# Patient Record
Sex: Male | Born: 1980 | Race: White | Hispanic: No | Marital: Single | Smoking: Current every day smoker
Health system: Southern US, Community
[De-identification: ages and names within clinical notes are randomized; demographics above are authoritative.]

## PROBLEM LIST (undated history)

## (undated) HISTORY — PX: TYMPANOSTOMY TUBE PLACEMENT: SHX32

---

## 2014-11-30 ENCOUNTER — Ambulatory Visit
Admit: 2014-11-30 | Disposition: A | Discharge status: Home / Self Care | Payer: Self-pay | Attending: Family Medicine | Admitting: Family Medicine

## 2014-11-30 LAB — URINALYSIS, COMPLETE
BILIRUBIN, UR: NEGATIVE
Bacteria: NEGATIVE
Blood: NEGATIVE
Glucose,UR: NEGATIVE
Ketone: NEGATIVE
Leukocyte Esterase: NEGATIVE
Nitrite: NEGATIVE
PH: 5 (ref 5.0–8.0)
PROTEIN: NEGATIVE
RBC,UR: NONE SEEN /HPF (ref 0–5)
SPECIFIC GRAVITY: 1.01 (ref 1.000–1.030)
Squamous Epithelial: NONE SEEN

## 2014-11-30 LAB — OCCULT BLOOD X 1 CARD TO LAB, STOOL: Occult Blood, Feces: NEGATIVE

## 2016-03-13 ENCOUNTER — Encounter: Payer: Self-pay | Admitting: Emergency Medicine

## 2016-03-13 ENCOUNTER — Emergency Department
Admission: EM | Admit: 2016-03-13 | Discharge: 2016-03-13 | Disposition: A | Discharge status: Home / Self Care | Payer: Self-pay | Attending: Emergency Medicine | Admitting: Emergency Medicine

## 2016-03-13 DIAGNOSIS — F17200 Nicotine dependence, unspecified, uncomplicated: Secondary | ICD-10-CM | POA: Insufficient documentation

## 2016-03-13 DIAGNOSIS — B349 Viral infection, unspecified: Secondary | ICD-10-CM | POA: Insufficient documentation

## 2016-03-13 MED ORDER — IBUPROFEN 800 MG PO TABS: 800.0000 mg | ORAL_TABLET | Freq: Three times a day (TID) | ORAL | 0 refills | Status: DC | PRN

## 2016-03-13 MED ORDER — ONDANSETRON 8 MG PO TBDP
8.0000 mg | ORAL_TABLET | Freq: Once | ORAL | Status: AC
  Administered 2016-03-13: 8 mg via ORAL
  Filled 2016-03-13: qty 1

## 2016-03-13 MED ORDER — BENZONATATE 100 MG PO CAPS: 100.0000 mg | ORAL_CAPSULE | Freq: Three times a day (TID) | ORAL | 0 refills | Status: DC | PRN

## 2016-03-13 MED ORDER — FEXOFENADINE-PSEUDOEPHED ER 60-120 MG PO TB12: 1.0000 | ORAL_TABLET | Freq: Two times a day (BID) | ORAL | 0 refills | Status: DC

## 2016-03-13 MED ORDER — IBUPROFEN 800 MG PO TABS
800.0000 mg | ORAL_TABLET | Freq: Once | ORAL | Status: AC
  Administered 2016-03-13: 800 mg via ORAL
  Filled 2016-03-13: qty 1

## 2016-03-13 NOTE — ED Provider Notes (Signed)
[facility] Emergency Department Provider Note   ____________________________________________  Time seen: Approximately 5:45 PM  I have reviewed the triage vital signs and the nursing notes.   HISTORY  Chief Complaint Generalized Body Aches    HPI Mark Gordon is a 35 y.o. male patient complaining of 2 days of fever, chills, nasal congestion and facial pressure and pain. Patient also complaining of nonproductive cough. Patient stated he had 2 episodes of vomiting yesterday but only had nausea today. Patient denies diarrhea. Also complaining of generalized body aches. No palliative measures taken for this complaint.  History reviewed. No pertinent past medical history.  There are no active problems to display for this patient.   History reviewed. No pertinent surgical history.    Allergies Review of patient's allergies indicates no known allergies.  No family history on file.  Social History Social History  Substance Use Topics  . Smoking status: Current Every Day Smoker  . Smokeless tobacco: Never Used  . Alcohol use Not on file    Review of Systems Constitutional:  objective fever and chills. Generalized body aches Eyes: No visual changes. ENT: Sore throat Cardiovascular: Denies chest pain. Respiratory: Denies shortness of breath. Nonproductive cough Gastrointestinal: No abdominal pain.  Nausea without vomiting.  No diarrhea.  No constipation. Genitourinary: Negative for dysuria. Musculoskeletal: Negative for back pain. Skin: Negative for rash. Neurological: Frontal headache for headaches, focal weakness or numbness.    ____________________________________________   PHYSICAL EXAM:  VITAL SIGNS: ED Triage Vitals  Enc Vitals Group     BP 03/13/16 1734 124/86     Pulse Rate 03/13/16 1734 76     Resp 03/13/16 1734 18     Temp 03/13/16 1734 98.1 F (36.7 C)     Temp src --      SpO2 03/13/16 1734 98 %     Weight  03/13/16 1734 245 lb (111.1 kg)     Height 03/13/16 1734  (1.88 m)     Head Circumference --      Peak Flow --      Pain Score 03/13/16 1735 7     Pain Loc --      Pain Edu? --      Excl. in GC? --     Constitutional: Alert and oriented. Well appearing and in no acute distress. Eyes: Conjunctivae are normal. PERRL. EOMI. Head: Atraumatic. Nose: Nasal congestion with clear rhinorrhea Mouth/Throat: Mucous membranes are moist.  Oropharynx erythematous without exudative tonsils Neck: No stridor.  No cervical spine tenderness to palpation. Hematological/Lymphatic/Immunilogical: No cervical lymphadenopathy. Cardiovascular: Normal rate, regular rhythm. Grossly normal heart sounds.  Good peripheral circulation. Respiratory: Normal respiratory effort.  No retractions. Lungs CTAB. Gastrointestinal: Soft and nontender. No distention. No abdominal bruits. No CVA tenderness. Musculoskeletal: No lower extremity tenderness nor edema.  No joint effusions. Neurologic:  Normal speech and language. No gross focal neurologic deficits are appreciated. No gait instability. Skin:  Skin is warm, dry and intact. No rash noted. Psychiatric: Mood and affect are normal. Speech and behavior are normal.  ____________________________________________   LABS (all labs ordered are listed, but only abnormal results are displayed)  Labs Reviewed - No data to display ____________________________________________  EKG   ____________________________________________  RADIOLOGY   ____________________________________________   PROCEDURES  Procedure(s) performed: None  Procedures  Critical Care performed: No  ____________________________________________   INITIAL IMPRESSION / ASSESSMENT AND PLAN / ED COURSE  Pertinent labs & imaging results that were available during my care  of the patient were reviewed by me and considered in my medical decision making (see chart for details).  Viral illness.  Patient given discharge care instructions. Patient given a prescription for Allegra-D, ibuprofen, and Tessalon Perles. Patient given a work note. Patient [facility].  Clinical Course     ____________________________________________   FINAL CLINICAL IMPRESSION(S) / ED DIAGNOSES  Final diagnoses:  None      NEW MEDICATIONS STARTED DURING THIS VISIT:  New Prescriptions   No medications on file     Note:  This document was prepared using Dragon voice recognition software and may include unintentional dictation errors.    Joni Reining, PA-C 03/13/16 1754    Myrna Blazer, MD 03/13/16 [phone]

## 2016-03-13 NOTE — ED Notes (Signed)
States he developed body aches with some nausea which started couple of days ago.  Afebrile on arrival

## 2016-03-13 NOTE — ED Triage Notes (Signed)
Pt presents with all over body aches and chills for a couple of days.

## 2016-06-20 ENCOUNTER — Emergency Department
Admission: EM | Admit: 2016-06-20 | Discharge: 2016-06-20 | Disposition: A | Discharge status: Home / Self Care | Payer: Self-pay | Attending: Emergency Medicine | Admitting: Emergency Medicine

## 2016-06-20 DIAGNOSIS — J069 Acute upper respiratory infection, unspecified: Secondary | ICD-10-CM | POA: Insufficient documentation

## 2016-06-20 DIAGNOSIS — Z791 Long term (current) use of non-steroidal anti-inflammatories (NSAID): Secondary | ICD-10-CM | POA: Insufficient documentation

## 2016-06-20 DIAGNOSIS — F17200 Nicotine dependence, unspecified, uncomplicated: Secondary | ICD-10-CM | POA: Insufficient documentation

## 2016-06-20 LAB — POCT RAPID STREP A: Streptococcus, Group A Screen (Direct): NEGATIVE

## 2016-06-20 MED ORDER — IBUPROFEN 800 MG PO TABS
800.0000 mg | ORAL_TABLET | Freq: Once | ORAL | Status: AC
  Administered 2016-06-20: 800 mg via ORAL
  Filled 2016-06-20: qty 1

## 2016-06-20 MED ORDER — BENZONATATE 100 MG PO CAPS: 100.0000 mg | ORAL_CAPSULE | Freq: Four times a day (QID) | ORAL | 0 refills | Status: DC | PRN

## 2016-06-20 MED ORDER — GUAIFENESIN ER 600 MG PO TB12: 600.0000 mg | ORAL_TABLET | Freq: Two times a day (BID) | ORAL | 0 refills | Status: AC

## 2016-06-20 NOTE — ED Provider Notes (Signed)
[facility] Emergency Department Provider Note  ____________________________________________  Time seen: Approximately 7:18 PM  I have reviewed the triage vital signs and the nursing notes.   HISTORY  Chief Complaint Sore Throat and Nasal Congestion    HPI Mark Gordon is a 35 y.o. male  with ongoing tobacco abuse presenting withseveral days of cough that is occasionally productive of clear phlegm, congestion and rhinorrhea, and mild sore throat. No ear pain. No shortness of breath. He has had fevers just over 100 as recently as yesterday. Positive mild headache. No nausea vomiting or diarrhea, myalgias, or abdominal pain. Has tried Nyquil and Dayquil with some improvement. There are multiple sick contacts at work with similar symptoms. No travel outside the [place].   History reviewed. No pertinent past medical history.  There are no active problems to display for this patient.   History reviewed. No pertinent surgical history.  Current Outpatient Rx  . Order #: [id number] Class: Print  . Order #: [id number] Class: Print  . Order #: [id number] Class: Print  . Order #: [id number] Class: Print    Allergies Review of patient's allergies indicates no known allergies.  No family history on file.  Social History Social History  Substance Use Topics  . Smoking status: Current Every Day Smoker  . Smokeless tobacco: Never Used  . Alcohol use No    Review of Systems Constitutional: Positive fever.  No myalgia. Eyes: No visual changes.  No eye discharge. ENT: No sore throat. Positive congestion and rhinorrhea. Cardiovascular: Denies chest pain. Denies palpitations. Respiratory: Denies shortness of breath.  Positive productive cough. Gastrointestinal: No abdominal pain.  No nausea, no vomiting.  No diarrhea.  No constipation. Genitourinary: Negative for dysuria. Musculoskeletal: Negative for back pain. Skin: Negative for rash. Neurological:  Positive for headaches. No focal numbness, tingling or weakness.   10-point ROS otherwise negative.  ____________________________________________   PHYSICAL EXAM:  VITAL SIGNS: ED Triage Vitals [06/20/16 1840]  Enc Vitals Group     BP (!) 130/98     Pulse Rate 95     Resp 18     Temp 97.9 F (36.6 C)     Temp Source Oral     SpO2 98 %     Weight 225 lb (102.1 kg)     Height 6\' 2"  (1.88 m)     Head Circumference      Peak Flow      Pain Score 6     Pain Loc      Pain Edu?      Excl. in GC?     Constitutional: Alert and oriented. Coughing but comfortable and in no acute distress. Answers questions appropriately. Eyes: Conjunctivae are normal.  EOMI. No scleral icterus.  No eye discharge. EARS: TM's are obscured by wax bilaterally. Head: Atraumatic. Nose: Positive congestion/rhinnorhea. Mouth/Throat: Mucous membranes are moist. Mild posterior pharyngeal erythema without tonsillar swelling or exudate. Posterior palate is symmetric and uvula is midline. Positive halitosis. Neck: No stridor.  Supple.  No meningismus. Cardiovascular: Normal rate, regular rhythm. No murmurs, rubs or gallops.  Respiratory: Normal respiratory effort.  No accessory muscle use or retractions. Lungs CTAB.  No wheezes, rales or ronchi. Gastrointestinal: Soft, nontender and nondistended.  No guarding or rebound.  No peritoneal signs. Musculoskeletal: No LE edema.  Neurologic:  A&Ox3.  Speech is clear.  Face and smile are symmetric.  EOMI.  Moves all extremities well. Skin:  Skin is warm, dry and intact. No rash noted. Psychiatric: Mood and affect  are normal. Speech and behavior are normal.  Normal judgement.  ____________________________________________   LABS (all labs ordered are listed, but only abnormal results are displayed)  Labs Reviewed  POCT RAPID STREP A   ____________________________________________  EKG  Not  indicated ____________________________________________  RADIOLOGY  No results found.  ____________________________________________   PROCEDURES  Procedure(s) performed: None  Procedures  Critical Care performed: No ____________________________________________   INITIAL IMPRESSION / ASSESSMENT AND PLAN / ED COURSE  Pertinent labs & imaging results that were available during my care of the patient were reviewed by me and considered in my medical decision making (see chart for details).  35 y.o. male with multiple sick contacts at work presenting with 2 days of cough productive of clear phlegm, fever, congestion and rhinorrhea, and mild headache. Overall, the patient's clinical history and physical examination is most consistent with an upper respiratory infection. I do hear abnormalities on his lung auscultation that would be concerning for pneumonia, and this is less likely. He does have some erythema in the posterior pharynx, we'll get a strep test. I have discussed expected management as well as symptomatic treatment with the patient at length, and if his workup in the emergency department is reassuring, plan discharge home. Return precautions as well as follow-up instructions were discussed.  ____________________________________________  FINAL CLINICAL IMPRESSION(S) / ED DIAGNOSES  Final diagnoses:  Upper respiratory tract infection, unspecified type    Clinical Course      NEW MEDICATIONS STARTED DURING THIS VISIT:  New Prescriptions   BENZONATATE (TESSALON PERLES) 100 MG CAPSULE    Take 1 capsule (100 mg total) by mouth every 6 (six) hours as needed for cough.   GUAIFENESIN (MUCINEX) 600 MG 12 HR TABLET    Take 1 tablet (600 mg total) by mouth 2 (two) times daily.      Rockne MenghiniAnne-Caroline Zyliah Schier, MD 06/20/16 1952

## 2016-06-20 NOTE — ED Triage Notes (Signed)
Sore throat and clear phlem with productive cough X 2 days. Headache. Pt alert and oriented X4, active, cooperative, pt in NAD. RR even and unlabored, color WNL.

## 2016-06-20 NOTE — Discharge Instructions (Signed)
Please drink plenty of fluid to stay well-hydrated. Please get plenty of rest. Please practice frequent and good handwashing to prevent the spread of infection.  You may take Mucinex for your congestion, or additionally may try a Netty pot.  Tessalon Perles or for cough. You may take Tylenol or Motrin for fever and pain.  Return to the emergency department if you develop severe pain, shortness of breath, drooling, fever, or any other symptoms concerning to you.

## 2016-11-20 ENCOUNTER — Emergency Department
Admission: EM | Admit: 2016-11-20 | Discharge: 2016-11-20 | Disposition: A | Discharge status: Home / Self Care | Payer: Self-pay | Attending: Emergency Medicine | Admitting: Emergency Medicine

## 2016-11-20 ENCOUNTER — Encounter: Payer: Self-pay | Admitting: Emergency Medicine

## 2016-11-20 DIAGNOSIS — F17200 Nicotine dependence, unspecified, uncomplicated: Secondary | ICD-10-CM | POA: Insufficient documentation

## 2016-11-20 DIAGNOSIS — J111 Influenza due to unidentified influenza virus with other respiratory manifestations: Secondary | ICD-10-CM | POA: Insufficient documentation

## 2016-11-20 MED ORDER — OSELTAMIVIR PHOSPHATE 75 MG PO CAPS: 75.0000 mg | ORAL_CAPSULE | Freq: Two times a day (BID) | ORAL | 0 refills | Status: AC

## 2016-11-20 MED ORDER — BENZONATATE 100 MG PO CAPS: 100.0000 mg | ORAL_CAPSULE | Freq: Three times a day (TID) | ORAL | 0 refills | Status: AC | PRN

## 2016-11-20 NOTE — ED Notes (Signed)
See triage note   States he developed cough and congestion couple of days ago  Unsure of fever   Is afebrile on arrival

## 2016-11-20 NOTE — ED Triage Notes (Signed)
Pt to ed with c/o cough and congestion since Sunday, denies fever.

## 2016-11-20 NOTE — ED Provider Notes (Signed)
[facility] Emergency Department Provider Note  ____________________________________________  Time seen: Approximately 6:36 PM  I have reviewed the triage vital signs and the nursing notes.   HISTORY  Chief Complaint Cough and Nasal Congestion    HPI Mark Gordon is a 36 y.o. male presenting to the emergency department with headache, cough, congestion and myalgias for the past 2 days. Patient has not evaluated his temperature. However, he has had chills. Patient has had intermittent nausea but no vomiting. Patient denies diarrhea. He has experienced diminished appetite and increased sleep. Patient denies chest pain, chest tightness and shortness of breath. No alleviating measures have been attempted.   History reviewed. No pertinent past medical history.  There are no active problems to display for this patient.   History reviewed. No pertinent surgical history.  Prior to Admission medications   Medication Sig Start Date End Date Taking? Authorizing Provider  benzonatate (TESSALON PERLES) 100 MG capsule Take 1 capsule (100 mg total) by mouth 3 (three) times daily as needed for cough. 11/20/16 11/27/16  Dayna Barker Crystalina Stodghill, PA-C  fexofenadine-pseudoephedrine (ALLEGRA-D) 60-120 MG 12 hr tablet Take 1 tablet by mouth 2 (two) times daily. 03/13/16   Joni Reining, PA-C  guaiFENesin (MUCINEX) 600 MG 12 hr tablet Take 1 tablet (600 mg total) by mouth 2 (two) times daily. 06/20/16 06/20/17  Anne-Caroline Sharma Covert, MD  ibuprofen (ADVIL,MOTRIN) 800 MG tablet Take 1 tablet (800 mg total) by mouth every 8 (eight) hours as needed for moderate pain. 03/13/16   Joni Reining, PA-C  oseltamivir (TAMIFLU) 75 MG capsule Take 1 capsule (75 mg total) by mouth 2 (two) times daily. 11/20/16 11/25/16  Orvil Feil, PA-C    Allergies Patient has no known allergies.  History reviewed. No pertinent family history.  Social History Social History  Substance Use Topics  . Smoking  status: Current Every Day Smoker  . Smokeless tobacco: Never Used  . Alcohol use No    Review of Systems  Constitutional: Patient has had chills Eyes: No visual changes. No discharge ENT: Patient has had congestion.  Cardiovascular: no chest pain. Respiratory: Patient has had non-productive cough.  No SOB. Gastrointestinal: Patient has had nausea.  Genitourinary: Negative for dysuria. No hematuria Musculoskeletal: Patient has had myalgias. Skin: Negative for rash, abrasions, lacerations, ecchymosis. Neurological: Patient has headaches, no focal weakness or numbness.  ____________________________________________   PHYSICAL EXAM:  VITAL SIGNS: ED Triage Vitals [11/20/16 1650]  Enc Vitals Group     BP 137/87     Pulse Rate 74     Resp 18     Temp 97.5 F (36.4 C)     Temp Source Oral     SpO2 98 %     Weight 225 lb (102.1 kg)     Height      Head Circumference      Peak Flow      Pain Score 8     Pain Loc      Pain Edu?      Excl. in GC?     Constitutional: Alert and oriented. Patient is lying supine in bed.  Eyes: Conjunctivae are normal. PERRL. EOMI. Head: Atraumatic. ENT:      Ears: Tympanic membranes are injected bilaterally without evidence of effusion or purulent exudate. Bony landmarks are visualized bilaterally. No pain with palpation at the tragus.      Nose: Nasal turbinates are edematous and erythematous. Trace rhinorrhea visualized.      Mouth/Throat: Mucous membranes  are moist. Posterior pharynx is mildly erythematous. No tonsillar hypertrophy or purulent exudate. Uvula is midline. Neck: Full range of motion. No pain is elicited with flexion at the neck. Hematological/Lymphatic/Immunilogical: No cervical lymphadenopathy. Cardiovascular: Normal rate, regular rhythm. Normal S1 and S2.  Good peripheral circulation. Respiratory: Normal respiratory effort without tachypnea or retractions. Lungs CTAB. Good air entry to the bases with no decreased or absent  breath sounds. Gastrointestinal: Bowel sounds 4 quadrants. Soft and nontender to palpation. No guarding or rigidity. No palpable masses. No distention. No CVA tenderness.  Skin:  Skin is warm, dry and intact. No rash noted. Psychiatric: Mood and affect are normal. Speech and behavior are normal. Patient exhibits appropriate insight and judgement.  ____________________________________________   LABS (all labs ordered are listed, but only abnormal results are displayed)  Labs Reviewed - No data to display ____________________________________________  EKG   ____________________________________________  RADIOLOGY   No results found.  ____________________________________________    PROCEDURES  Procedure(s) performed:    Procedures    Medications - No data to display   ____________________________________________   INITIAL IMPRESSION / ASSESSMENT AND PLAN / ED COURSE  Pertinent labs & imaging results that were available during my care of the patient were reviewed by me and considered in my medical decision making (see chart for details).  Review of the [place] CSRS was performed in accordance of the [facility] prior to dispensing any controlled drugs.     Assessment and Plan:  Influenza: Patient presents to the emergency department with headache, rhinorrhea, congestion, myalgias, fatigue and diarrhea. Symptoms are consistent with influenza. Tamiflu was prescribed at discharge. Rest and hydration were encouraged. Patient was advised to follow-up with his primary care provider in one week. Physical exam and vital signs are reassuring at this time. All patient questions were answered.  ____________________________________________  FINAL CLINICAL IMPRESSION(S) / ED DIAGNOSES  Final diagnoses:  Influenza      NEW MEDICATIONS STARTED DURING THIS VISIT:  Discharge Medication List as of 11/20/2016  6:22 PM    START taking these medications   Details  oseltamivir (TAMIFLU)  75 MG capsule Take 1 capsule (75 mg total) by mouth 2 (two) times daily., Starting Tue 11/20/2016, Until Sun 11/25/2016, Print            This chart was dictated using voice recognition software/Dragon. Despite best efforts to proofread, errors can occur which can change the meaning. Any change was purely unintentional.    Orvil Feil, PA-C 11/20/16 1840    Minna Antis, MD 11/20/16 2215

## 2017-05-07 ENCOUNTER — Emergency Department
Admission: EM | Admit: 2017-05-07 | Discharge: 2017-05-07 | Disposition: A | Discharge status: Home / Self Care | Payer: Self-pay | Attending: Emergency Medicine | Admitting: Emergency Medicine

## 2017-05-07 DIAGNOSIS — B349 Viral infection, unspecified: Secondary | ICD-10-CM | POA: Insufficient documentation

## 2017-05-07 DIAGNOSIS — Z79899 Other long term (current) drug therapy: Secondary | ICD-10-CM | POA: Insufficient documentation

## 2017-05-07 DIAGNOSIS — F17200 Nicotine dependence, unspecified, uncomplicated: Secondary | ICD-10-CM | POA: Insufficient documentation

## 2017-05-07 DIAGNOSIS — J069 Acute upper respiratory infection, unspecified: Secondary | ICD-10-CM | POA: Insufficient documentation

## 2017-05-07 MED ORDER — MELOXICAM 7.5 MG PO TABS: 7.5000 mg | ORAL_TABLET | Freq: Every day | ORAL | 1 refills | Status: AC

## 2017-05-07 MED ORDER — BENZONATATE 100 MG PO CAPS: 100.0000 mg | ORAL_CAPSULE | Freq: Three times a day (TID) | ORAL | 0 refills | Status: AC | PRN

## 2017-05-07 MED ORDER — KETOROLAC TROMETHAMINE 30 MG/ML IJ SOLN
30.0000 mg | Freq: Once | INTRAMUSCULAR | Status: AC
  Administered 2017-05-07: 30 mg via INTRAMUSCULAR
  Filled 2017-05-07: qty 1

## 2017-05-07 NOTE — ED Provider Notes (Signed)
[facility] Emergency Department Provider Note  ____________________________________________  Time seen: Approximately 9:08 PM  I have reviewed the triage vital signs and the nursing notes.   HISTORY  Chief Complaint Headache and Generalized Body Aches    HPI Mark Gordon is a 36 y.o. male presenting to the emergency department with rhinorrhea, congestion, nonproductive cough and myalgias for the past 2 days patient is tolerating fluids and food by mouth. No emesis or diarrhea. Patient has experienced increased sleep. Patient has tried ibuprofen and other OTC medications without relief. No other alleviating measures have been attempted.   History reviewed. No pertinent past medical history.  There are no active problems to display for this patient.   History reviewed. No pertinent surgical history.  Prior to Admission medications   Medication Sig Start Date End Date Taking? Authorizing Provider  benzonatate (TESSALON PERLES) 100 MG capsule Take 1 capsule (100 mg total) by mouth 3 (three) times daily as needed for cough. 05/07/17 05/14/17  Orvil Feil, PA-C  fexofenadine-pseudoephedrine (ALLEGRA-D) 60-120 MG 12 hr tablet Take 1 tablet by mouth 2 (two) times daily. 03/13/16   Joni Reining, PA-C  guaiFENesin (MUCINEX) 600 MG 12 hr tablet Take 1 tablet (600 mg total) by mouth 2 (two) times daily. 06/20/16 06/20/17  Rockne Menghini, MD  ibuprofen (ADVIL,MOTRIN) 800 MG tablet Take 1 tablet (800 mg total) by mouth every 8 (eight) hours as needed for moderate pain. 03/13/16   Joni Reining, PA-C  meloxicam (MOBIC) 7.5 MG tablet Take 1 tablet (7.5 mg total) by mouth daily. 05/07/17 05/14/17  Orvil Feil, PA-C    Allergies Patient has no known allergies.  No family history on file.  Social History Social History  Substance Use Topics  . Smoking status: Current Every Day Smoker  . Smokeless tobacco: Never Used  . Alcohol use No    Review of  Systems  Constitutional: Patient has had fever.  Eyes: No visual changes. No discharge ENT: Patient has had congestion.  Cardiovascular: no chest pain. Respiratory: Patient has had non-productive cough.  No SOB. Gastrointestinal: No nausea, vomiting or diarrhea. Genitourinary: Negative for dysuria. No hematuria Musculoskeletal: Patient has had myalgias. Skin: Negative for rash, abrasions, lacerations, ecchymosis. Neurological: Patient has had headaches, no focal weakness or numbness.  ____________________________________________   PHYSICAL EXAM:  VITAL SIGNS: ED Triage Vitals [05/07/17 1837]  Enc Vitals Group     BP 128/88     Pulse Rate 68     Resp 18     Temp 98.5 F (36.9 C)     Temp Source Oral     SpO2 99 %     Weight 230 lb (104.3 kg)     Height  (1.88 m)     Head Circumference      Peak Flow      Pain Score 8     Pain Loc      Pain Edu?      Excl. in GC?      Constitutional: Alert and oriented. Patient is lying supine in bed.  Eyes: Conjunctivae are normal. PERRL. EOMI. Head: Atraumatic. ENT:      Ears: Tympanic membranes are injected bilaterally without evidence of effusion or purulent exudate. Bony landmarks are visualized bilaterally. No pain with palpation at the tragus.      Nose: Nasal turbinates are edematous and erythematous. Copious rhinorrhea visualized.      Mouth/Throat: Mucous membranes are moist. Posterior pharynx is mildly erythematous. No  tonsillar hypertrophy or purulent exudate. Uvula is midline. Neck: Full range of motion. No pain is elicited with flexion at the neck. Hematological/Lymphatic/Immunilogical: No cervical lymphadenopathy. Cardiovascular: Normal rate, regular rhythm. Normal S1 and S2.  Good peripheral circulation. Respiratory: Normal respiratory effort without tachypnea or retractions. Lungs CTAB. Good air entry to the bases with no decreased or absent breath sounds. Gastrointestinal: Bowel sounds 4 quadrants. Soft and  nontender to palpation. No guarding or rigidity. No palpable masses. No distention. No CVA tenderness.  Skin:  Skin is warm, dry and intact. No rash noted. Psychiatric: Mood and affect are normal. Speech and behavior are normal. Patient exhibits appropriate insight and judgement.   ____________________________________________   LABS (all labs ordered are listed, but only abnormal results are displayed)  Labs Reviewed - No data to display ____________________________________________  EKG   ____________________________________________  RADIOLOGY   No results found.  ____________________________________________    PROCEDURES  Procedure(s) performed:    Procedures    Medications  ketorolac (TORADOL) 30 MG/ML injection 30 mg (30 mg Intramuscular Given 05/07/17 2000)     ____________________________________________   INITIAL IMPRESSION / ASSESSMENT AND PLAN / ED COURSE  Pertinent labs & imaging results that were available during my care of the patient were reviewed by me and considered in my medical decision making (see chart for details).  Review of the [place] CSRS was performed in accordance of the [facility] prior to dispensing any controlled drugs.     Assessment and plan Viral URI Patient presents to the emergency department with rhinorrhea, congestion, headache and myalgias for the past 2 days. History and physical exam findings are consistent with a viral upper respiratory tract infection. Patient was discharged with [facility] and Mobic. Rest and hydration were encouraged. Patient was advised to follow-up with his primary care provider in one week. Vital signs are reassuring prior to discharge. All patient questions were answered.   ____________________________________________  FINAL CLINICAL IMPRESSION(S) / ED DIAGNOSES  Final diagnoses:  Viral syndrome      NEW MEDICATIONS STARTED DURING THIS VISIT:  Discharge Medication List as of 05/07/2017  7:59  PM    START taking these medications   Details  benzonatate (TESSALON PERLES) 100 MG capsule Take 1 capsule (100 mg total) by mouth 3 (three) times daily as needed for cough., Starting Tue 05/07/2017, Until Tue 05/14/2017, Print    meloxicam (MOBIC) 7.5 MG tablet Take 1 tablet (7.5 mg total) by mouth daily., Starting Tue 05/07/2017, Until Tue 05/14/2017, Print            This chart was dictated using voice recognition software/Dragon. Despite best efforts to proofread, errors can occur which can change the meaning. Any change was purely unintentional.    Gasper Lloyd 05/07/17 2112    Nita Sickle, MD 05/08/17 2231

## 2017-05-07 NOTE — ED Triage Notes (Signed)
Headache and body aches X 2 days. Has taken OTC medication without relief.

## 2017-09-24 ENCOUNTER — Other Ambulatory Visit: Payer: Self-pay

## 2017-09-24 ENCOUNTER — Emergency Department
Admission: EM | Admit: 2017-09-24 | Discharge: 2017-09-24 | Disposition: A | Discharge status: Home / Self Care | Payer: Managed Care, Other (non HMO) | Attending: Emergency Medicine | Admitting: Emergency Medicine

## 2017-09-24 DIAGNOSIS — R69 Illness, unspecified: Secondary | ICD-10-CM

## 2017-09-24 DIAGNOSIS — F17210 Nicotine dependence, cigarettes, uncomplicated: Secondary | ICD-10-CM | POA: Diagnosis not present

## 2017-09-24 DIAGNOSIS — J111 Influenza due to unidentified influenza virus with other respiratory manifestations: Secondary | ICD-10-CM | POA: Diagnosis not present

## 2017-09-24 DIAGNOSIS — R05 Cough: Secondary | ICD-10-CM | POA: Diagnosis present

## 2017-09-24 MED ORDER — OSELTAMIVIR PHOSPHATE 75 MG PO CAPS: 75.0000 mg | ORAL_CAPSULE | Freq: Two times a day (BID) | ORAL | 0 refills | Status: AC

## 2017-09-24 MED ORDER — GUAIFENESIN-CODEINE 100-10 MG/5ML PO SOLN: 5.0000 mL | Freq: Four times a day (QID) | ORAL | 0 refills | Status: DC | PRN

## 2017-09-24 NOTE — Discharge Instructions (Signed)
Follow-up with your primary care doctor or [facility] if any continued problems.  Take Tylenol or ibuprofen as needed for fever and body aches.  Begin taking Tamiflu twice a day every day for the next 5 days for the flu.  You are also given a prescription for Robitussin-AC which is for cough and congestion.  Take this medication only as directed. Increase fluids.

## 2017-09-24 NOTE — ED Provider Notes (Signed)
[facility] Emergency Department Provider Note  ____________________________________________   First MD Initiated Contact with Patient 09/24/17 1650     (approximate)  I have reviewed the triage vital signs and the nursing notes.   HISTORY  Chief Complaint URI   HPI Mark Gordon is a 37 y.o. male is here with complaint of headache, body aches, nausea, cough and congestion that started suddenly yesterday.  Patient has not actually taken his temperature but states that is been "up and down".  Patient has been taking over-the-counter medication for his fever.  No other family members are sick at this time however he was around some children that were sick over the weekend.  He rates his pain as an 8 out of 10.  History reviewed. No pertinent past medical history.  There are no active problems to display for this patient.   History reviewed. No pertinent surgical history.  Prior to Admission medications   Medication Sig Start Date End Date Taking? Authorizing Provider  guaiFENesin-codeine 100-10 MG/5ML syrup Take 5 mLs by mouth every 6 (six) hours as needed for cough. 09/24/17   Tommi RumpsSummers, Cecile Gillispie L, PA-C  oseltamivir (TAMIFLU) 75 MG capsule Take 1 capsule (75 mg total) by mouth 2 (two) times daily for 5 days. 09/24/17 09/29/17  Tommi RumpsSummers, Lauralyn Shadowens L, PA-C    Allergies Patient has no known allergies.  No family history on file.  Social History Social History   Tobacco Use  . Smoking status: Current Every Day Smoker  . Smokeless tobacco: Never Used  Substance Use Topics  . Alcohol use: Yes  . Drug use: No    Review of Systems Constitutional: Positive fever/chills Eyes: No visual changes. ENT: No sore throat. Cardiovascular: Denies chest pain. Respiratory: Denies shortness of breath.  Positive for coughing. Gastrointestinal: No abdominal pain.  No nausea, no vomiting.  No diarrhea.   Genitourinary: Negative for dysuria. Musculoskeletal: Positive  for muscle aches. Skin: Negative for rash. Neurological: Negative for headaches, focal weakness or numbness. ___________________________________________   PHYSICAL EXAM:  VITAL SIGNS: ED Triage Vitals  Enc Vitals Group     BP 09/24/17 1626 (!) 134/93     Pulse Rate 09/24/17 1626 86     Resp 09/24/17 1626 17     Temp 09/24/17 1626 98.3 F (36.8 C)     Temp Source 09/24/17 1626 Oral     SpO2 09/24/17 1626 98 %     Weight 09/24/17 1627 225 lb (102.1 kg)     Height 09/24/17 1627 6\' 2"  (1.88 m)     Head Circumference --      Peak Flow --      Pain Score 09/24/17 1626 8     Pain Loc --      Pain Edu? --      Excl. in GC? --    Constitutional: Alert and oriented. Well appearing and in no acute distress. Eyes: Conjunctivae are normal.  Head: Atraumatic. Nose: Mild congestion/rhinnorhea.  TMs are dull bilaterally. Mouth/Throat: Mucous membranes are moist.  Oropharynx non-erythematous. Neck: No stridor.   Hematological/Lymphatic/Immunilogical: No cervical lymphadenopathy. Cardiovascular: Normal rate, regular rhythm. Grossly normal heart sounds.  Good peripheral circulation. Respiratory: Normal respiratory effort.  No retractions. Lungs CTAB. Gastrointestinal: Soft and nontender. No distention.  Musculoskeletal: Moves upper and lower extremities without any difficulty.  Normal gait was noted. Neurologic:  Normal speech and language. No gross focal neurologic deficits are appreciated. No gait instability. Skin:  Skin is warm, dry and intact. No  rash noted. Psychiatric: Mood and affect are normal. Speech and behavior are normal.  ____________________________________________   LABS (all labs ordered are listed, but only abnormal results are displayed)  Labs Reviewed - No data to display   PROCEDURES  Procedure(s) performed: None  Procedures  Critical Care performed: No  ____________________________________________   INITIAL IMPRESSION / ASSESSMENT AND PLAN / ED  COURSE Patient was sudden onset of symptoms starting yesterday is consistent with influenza.  Patient agrees that he does not need to be tested.  He was given a prescription for Tamiflu 75 mg twice daily for 5 days and Robitussin-AC as needed for cough and congestion.  He will continue with Tylenol and ibuprofen as needed for fever.  Increase fluids.  He was also given a note to remain out of work.  ____________________________________________   FINAL CLINICAL IMPRESSION(S) / ED DIAGNOSES  Final diagnoses:  Influenza-like illness     ED Discharge Orders        Ordered    oseltamivir (TAMIFLU) 75 MG capsule  2 times daily     09/24/17 1657    guaiFENesin-codeine 100-10 MG/5ML syrup  Every 6 hours PRN     09/24/17 1657       Note:  This document was prepared using Dragon voice recognition software and may include unintentional dictation errors.    Tommi Rumps, PA-C 09/24/17 1722    Phineas Semen, MD 09/24/17 [phone]

## 2017-09-24 NOTE — ED Notes (Signed)
Pt discharged to home.  Family member driving.  Discharge instructions reviewed.  Verbalized understanding.  No questions or concerns at this time.  Teach back verified.  Pt in NAD.  No items left in ED.   

## 2017-09-24 NOTE — ED Notes (Signed)
Pt states he has been having flu-like symptoms for a couple of days.  Pt reports vomiting, feeling tired, and coughing a lot.  Pt is A&Ox4, in NAD.

## 2017-09-24 NOTE — ED Triage Notes (Signed)
Pt c/o bodyaches, nausea, HA, cough with congestion that started yesterday.

## 2017-09-26 ENCOUNTER — Emergency Department
Admission: EM | Admit: 2017-09-26 | Discharge: 2017-09-26 | Disposition: A | Discharge status: Home / Self Care | Payer: Managed Care, Other (non HMO) | Attending: Emergency Medicine | Admitting: Emergency Medicine

## 2017-09-26 ENCOUNTER — Other Ambulatory Visit: Payer: Self-pay

## 2017-09-26 ENCOUNTER — Encounter: Payer: Self-pay | Admitting: Emergency Medicine

## 2017-09-26 DIAGNOSIS — R0981 Nasal congestion: Secondary | ICD-10-CM | POA: Insufficient documentation

## 2017-09-26 DIAGNOSIS — F17200 Nicotine dependence, unspecified, uncomplicated: Secondary | ICD-10-CM | POA: Diagnosis not present

## 2017-09-26 DIAGNOSIS — J3489 Other specified disorders of nose and nasal sinuses: Secondary | ICD-10-CM | POA: Diagnosis not present

## 2017-09-26 DIAGNOSIS — R51 Headache: Secondary | ICD-10-CM | POA: Diagnosis present

## 2017-09-26 DIAGNOSIS — M7918 Myalgia, other site: Secondary | ICD-10-CM | POA: Insufficient documentation

## 2017-09-26 DIAGNOSIS — R112 Nausea with vomiting, unspecified: Secondary | ICD-10-CM | POA: Insufficient documentation

## 2017-09-26 DIAGNOSIS — R69 Illness, unspecified: Secondary | ICD-10-CM | POA: Diagnosis not present

## 2017-09-26 DIAGNOSIS — J111 Influenza due to unidentified influenza virus with other respiratory manifestations: Secondary | ICD-10-CM

## 2017-09-26 MED ORDER — ONDANSETRON HCL 4 MG PO TABS
4.0000 mg | ORAL_TABLET | Freq: Once | ORAL | Status: AC
  Administered 2017-09-26: 4 mg via ORAL
  Filled 2017-09-26: qty 1

## 2017-09-26 MED ORDER — KETOROLAC TROMETHAMINE 30 MG/ML IJ SOLN
30.0000 mg | Freq: Once | INTRAMUSCULAR | Status: AC
  Administered 2017-09-26: 30 mg via INTRAMUSCULAR
  Filled 2017-09-26: qty 1

## 2017-09-26 MED ORDER — ONDANSETRON HCL 4 MG PO TABS: 4.0000 mg | ORAL_TABLET | Freq: Every day | ORAL | 0 refills | Status: DC | PRN

## 2017-09-26 MED ORDER — IBUPROFEN 800 MG PO TABS: 800.0000 mg | ORAL_TABLET | Freq: Three times a day (TID) | ORAL | 0 refills | Status: DC | PRN

## 2017-09-26 NOTE — ED Notes (Signed)
See triage note   States he was seen 2 days ago   States he is still having headache ,body aches and nausea.Mark Gordon.afebrile on arrival  States he is taking the cough meds with tylenol/motrin  W/o relief

## 2017-09-26 NOTE — ED Provider Notes (Signed)
[facility] Emergency Department Provider Note  ____________________________________________  Time seen: Approximately 3:11 PM  I have reviewed the triage vital signs and the nursing notes.   HISTORY  Chief Complaint No chief complaint on file.    HPI Mark Gordon is a 37 y.o. male that presents emergency department for evaluation of continued headache and body aches after being diagnosed with influenza 2 days ago.  He was nauseous this morning and vomited once.  Patient also has a nonproductive cough but this has improved with the cough medication that he is prescribed.  He was prescribed Tamiflu but never began medication because it was expensive.  He has not taken any Tylenol or ibuprofen today.  No shortness of breath, chest pain, abdominal pain.   History reviewed. No pertinent past medical history.  There are no active problems to display for this patient.   History reviewed. No pertinent surgical history.  Prior to Admission medications   Medication Sig Start Date End Date Taking? Authorizing Provider  guaiFENesin-codeine 100-10 MG/5ML syrup Take 5 mLs by mouth every 6 (six) hours as needed for cough. 09/24/17   Tommi Rumps, PA-C  ibuprofen (ADVIL,MOTRIN) 800 MG tablet Take 1 tablet (800 mg total) by mouth every 8 (eight) hours as needed. 09/26/17   Enid Derry, PA-C  ondansetron (ZOFRAN) 4 MG tablet Take 1 tablet (4 mg total) by mouth daily as needed for nausea or vomiting. 09/26/17 09/26/18  Enid Derry, PA-C  oseltamivir (TAMIFLU) 75 MG capsule Take 1 capsule (75 mg total) by mouth 2 (two) times daily for 5 days. 09/24/17 09/29/17  Tommi Rumps, PA-C    Allergies Patient has no known allergies.  No family history on file.  Social History Social History   Tobacco Use  . Smoking status: Current Every Day Smoker  . Smokeless tobacco: Never Used  Substance Use Topics  . Alcohol use: Yes  . Drug use: No     Review of Systems   Eyes: No visual changes. No discharge. ENT: Positive for congestion and rhinorrhea. Cardiovascular: No chest pain. Respiratory: Positive for cough. No SOB. Gastrointestinal: No abdominal pain.  No nausea, no vomiting.  No diarrhea.  No constipation. Musculoskeletal: Positive for body aches. Skin: Negative for rash, abrasions, lacerations, ecchymosis. Neurological: Positive for headache.   ____________________________________________   PHYSICAL EXAM:  VITAL SIGNS: ED Triage Vitals  Enc Vitals Group     BP 09/26/17 1204 123/76     Pulse Rate 09/26/17 1204 66     Resp 09/26/17 1204 16     Temp 09/26/17 1204 98.5 F (36.9 C)     Temp Source 09/26/17 1204 Oral     SpO2 09/26/17 1204 98 %     Weight --      Height --      Head Circumference --      Peak Flow --      Pain Score 09/26/17 1210 8     Pain Loc --      Pain Edu? --      Excl. in GC? --      Constitutional: Alert and oriented. Well appearing and in no acute distress. Eyes: Conjunctivae are normal. PERRL. EOMI. No discharge. Head: Atraumatic. ENT: No frontal and maxillary sinus tenderness.      Ears: Tympanic membranes pearly gray with good landmarks. No discharge.      Nose: Mild congestion/rhinnorhea.      Mouth/Throat: Mucous membranes are moist. Oropharynx non-erythematous. Tonsils not enlarged.  No exudates. Uvula midline. Neck: No stridor.   Hematological/Lymphatic/Immunilogical: No cervical lymphadenopathy. Cardiovascular: Normal rate, regular rhythm.  Good peripheral circulation. Respiratory: Normal respiratory effort without tachypnea or retractions. Lungs CTAB. Good air entry to the bases with no decreased or absent breath sounds. Gastrointestinal: Bowel sounds 4 quadrants. Soft and nontender to palpation. No guarding or rigidity. No palpable masses. No distention. Musculoskeletal: Full range of motion to all extremities. No gross deformities appreciated. Neurologic:  Normal speech and language. No  gross focal neurologic deficits are appreciated.  Skin:  Skin is warm, dry and intact. No rash noted.  ____________________________________________   LABS (all labs ordered are listed, but only abnormal results are displayed)  Labs Reviewed - No data to display ____________________________________________  EKG   ____________________________________________  RADIOLOGY   ____________________________________________    PROCEDURES  Procedure(s) performed:    Procedures    Medications  ketorolac (TORADOL) 30 MG/ML injection 30 mg (30 mg Intramuscular Given 09/26/17 1322)  ondansetron (ZOFRAN) tablet 4 mg (4 mg Oral Given 09/26/17 1322)     ____________________________________________   INITIAL IMPRESSION / ASSESSMENT AND PLAN / ED COURSE  Pertinent labs & imaging results that were available during my care of the patient were reviewed by me and considered in my medical decision making (see chart for details).  Review of the [place] CSRS was performed in accordance of the [facility] prior to dispensing any controlled drugs.     Patient's diagnosis is consistent with influenza. Vital signs and exam are reassuring.  Headache and body aches improved with Toradol.  Nausea resolved with Zofran.  Cough has been improving since diagnosis of influenza 2 days ago.  Symptoms are consistent with influenza.  Patient appears well and is staying well hydrated. Patient should alternate tylenol and ibuprofen for fever and body aches. Patient feels comfortable going home.  Work note was provided.  Patient will be discharged home with prescriptions for zofran and ibuprofen. Patient is to follow up with PCP as needed or otherwise directed. Patient is given ED precautions to return to the ED for any worsening or new symptoms.     ____________________________________________  FINAL CLINICAL IMPRESSION(S) / ED DIAGNOSES  Final diagnoses:  Influenza-like illness      NEW MEDICATIONS STARTED  DURING THIS VISIT:  ED Discharge Orders        Ordered    ondansetron (ZOFRAN) 4 MG tablet  Daily PRN     09/26/17 1420    ibuprofen (ADVIL,MOTRIN) 800 MG tablet  Every 8 hours PRN     09/26/17 1420          This chart was dictated using voice recognition software/Dragon. Despite best efforts to proofread, errors can occur which can change the meaning. Any change was purely unintentional.    Enid DerryWagner, Vonya Ohalloran, PA-C 09/26/17 1541    Phineas SemenGoodman, Graydon, MD 09/27/17 [phone]

## 2017-09-26 NOTE — ED Triage Notes (Signed)
Says he was dx with flu.  Has been taking the med for cough, but still feels congested, body aches, headaches and nausea. Says he vomited once today and had dry heaves once

## 2017-12-17 ENCOUNTER — Emergency Department
Admission: EM | Admit: 2017-12-17 | Discharge: 2017-12-17 | Disposition: A | Discharge status: Home / Self Care | Payer: Managed Care, Other (non HMO) | Attending: Emergency Medicine | Admitting: Emergency Medicine

## 2017-12-17 ENCOUNTER — Encounter: Payer: Self-pay | Admitting: Emergency Medicine

## 2017-12-17 ENCOUNTER — Other Ambulatory Visit: Payer: Self-pay

## 2017-12-17 DIAGNOSIS — K529 Noninfective gastroenteritis and colitis, unspecified: Secondary | ICD-10-CM | POA: Diagnosis not present

## 2017-12-17 DIAGNOSIS — F17210 Nicotine dependence, cigarettes, uncomplicated: Secondary | ICD-10-CM | POA: Insufficient documentation

## 2017-12-17 DIAGNOSIS — M791 Myalgia, unspecified site: Secondary | ICD-10-CM | POA: Diagnosis present

## 2017-12-17 LAB — CBC WITH DIFFERENTIAL/PLATELET
BASOS PCT: 1 %
Basophils Absolute: 0 10*3/uL (ref 0–0.1)
EOS ABS: 0.2 10*3/uL (ref 0–0.7)
Eosinophils Relative: 3 %
HCT: 55.1 % — ABNORMAL HIGH (ref 40.0–52.0)
Hemoglobin: 19.2 g/dL — ABNORMAL HIGH (ref 13.0–18.0)
Lymphocytes Relative: 13 %
Lymphs Abs: 0.9 10*3/uL — ABNORMAL LOW (ref 1.0–3.6)
MCH: 33.6 pg (ref 26.0–34.0)
MCHC: 34.8 g/dL (ref 32.0–36.0)
MCV: 96.5 fL (ref 80.0–100.0)
MONO ABS: 0.5 10*3/uL (ref 0.2–1.0)
Monocytes Relative: 8 %
Neutro Abs: 5.3 10*3/uL (ref 1.4–6.5)
Neutrophils Relative %: 75 %
PLATELETS: 187 10*3/uL (ref 150–440)
RBC: 5.7 MIL/uL (ref 4.40–5.90)
RDW: 14 % (ref 11.5–14.5)
WBC: 6.9 10*3/uL (ref 3.8–10.6)

## 2017-12-17 LAB — COMPREHENSIVE METABOLIC PANEL
ALT: 38 U/L (ref 17–63)
AST: 43 U/L — ABNORMAL HIGH (ref 15–41)
Albumin: 4.4 g/dL (ref 3.5–5.0)
Alkaline Phosphatase: 107 U/L (ref 38–126)
Anion gap: 10 (ref 5–15)
BILIRUBIN TOTAL: 1.6 mg/dL — AB (ref 0.3–1.2)
BUN: 13 mg/dL (ref 6–20)
CO2: 24 mmol/L (ref 22–32)
Calcium: 9.3 mg/dL (ref 8.9–10.3)
Chloride: 100 mmol/L — ABNORMAL LOW (ref 101–111)
Creatinine, Ser: 1.09 mg/dL (ref 0.61–1.24)
GFR calc Af Amer: >60 mL/min (ref >60)
Glucose, Bld: 90 mg/dL (ref 65–99)
Potassium: 4.2 mmol/L (ref 3.5–5.1)
Sodium: 134 mmol/L — ABNORMAL LOW (ref 135–145)
TOTAL PROTEIN: 7.6 g/dL (ref 6.5–8.1)

## 2017-12-17 LAB — LIPASE, BLOOD: Lipase: 23 U/L (ref 11–51)

## 2017-12-17 MED ORDER — SODIUM CHLORIDE 0.9 % IV BOLUS
1000.0000 mL | Freq: Once | INTRAVENOUS | Status: AC
  Administered 2017-12-17: 1000 mL via INTRAVENOUS

## 2017-12-17 MED ORDER — ONDANSETRON HCL 4 MG/2ML IJ SOLN
4.0000 mg | Freq: Once | INTRAMUSCULAR | Status: AC
  Administered 2017-12-17: 4 mg via INTRAVENOUS
  Filled 2017-12-17: qty 2

## 2017-12-17 MED ORDER — ONDANSETRON 4 MG PO TBDP: 4.0000 mg | ORAL_TABLET | Freq: Three times a day (TID) | ORAL | 0 refills | Status: DC | PRN

## 2017-12-17 NOTE — ED Triage Notes (Signed)
Pt to ED from home c/o generalized body aches, nausea, congestion since Sunday night.

## 2017-12-17 NOTE — Discharge Instructions (Addendum)
Follow-up with your regular doctor or an acute care to have your CBC rechecked in 1 week.  If your hematocrit continues to stay high you should at least donate 1 L of blood to the blood bank and then follow-up with hematology.  A regular physician would have to refer you to the hematologist.  For the vomiting use the Zofran as needed.  For diarrhea use over-the-counter Imodium.  Try to eat a brat diet which includes bananas rice applesauce and toast.  A bland diet will help you with the diarrhea symptoms.

## 2017-12-17 NOTE — ED Provider Notes (Signed)
[facility] Emergency Department Provider Note  ____________________________________________   First MD Initiated Contact with Patient 12/17/17 1714     (approximate)  I have reviewed the triage vital signs and the nursing notes.   HISTORY  Chief Complaint Nausea; Generalized Body Aches; and Nasal Congestion    HPI Mark Gordon is a 37 y.o. male since emergency department complaining of body aches, nausea, and vomiting with diarrhea that started yesterday.  States the diarrhea is very watery.  He has had vomiting where he is unable to keep liquids down.  He denies any other issues at this time.  No one else at home has been sick with the same symptoms.  History reviewed. No pertinent past medical history.  There are no active problems to display for this patient.   Past Surgical History:  Procedure Laterality Date  . TYMPANOSTOMY TUBE PLACEMENT      Prior to Admission medications   Medication Sig Start Date End Date Taking? Authorizing Provider  guaiFENesin-codeine 100-10 MG/5ML syrup Take 5 mLs by mouth every 6 (six) hours as needed for cough. 09/24/17   Tommi Rumps, PA-C  ibuprofen (ADVIL,MOTRIN) 800 MG tablet Take 1 tablet (800 mg total) by mouth every 8 (eight) hours as needed. 09/26/17   Enid Derry, PA-C  ondansetron (ZOFRAN) 4 MG tablet Take 1 tablet (4 mg total) by mouth daily as needed for nausea or vomiting. 09/26/17 09/26/18  Enid Derry, PA-C  ondansetron (ZOFRAN-ODT) 4 MG disintegrating tablet Take 1 tablet (4 mg total) by mouth every 8 (eight) hours as needed for nausea or vomiting. 12/17/17   Sherrie Mustache Roselyn Bering, PA-C    Allergies Patient has no known allergies.  History reviewed. No pertinent family history.  Social History Social History   Tobacco Use  . Smoking status: Current Every Day Smoker    Packs/day: 0.25    Types: Cigarettes  . Smokeless tobacco: Never Used  Substance Use Topics  . Alcohol use: Yes   Comment: occ. on weekends  . Drug use: No    Review of Systems  Constitutional: No fever/chills Eyes: No visual changes. ENT: No sore throat. Respiratory: Denies cough Gastrointestinal: Positive for nausea, vomiting and diarrhea Genitourinary: Negative for dysuria. Musculoskeletal: Negative for back pain. Skin: Negative for rash.    ____________________________________________   PHYSICAL EXAM:  VITAL SIGNS: ED Triage Vitals  Enc Vitals Group     BP 12/17/17 1656 132/88     Pulse Rate 12/17/17 1656 73     Resp 12/17/17 1656 18     Temp 12/17/17 1656 98.2 F (36.8 C)     Temp src --      SpO2 12/17/17 1656 98 %     Weight 12/17/17 1656 225 lb (102.1 kg)     Height 12/17/17 1656  (1.88 m)     Head Circumference --      Peak Flow --      Pain Score 12/17/17 1704 9     Pain Loc --      Pain Edu? --      Excl. in GC? --     Constitutional: Alert and oriented. Well appearing and in no acute distress. Eyes: Conjunctivae are normal.  Head: Atraumatic. Nose: No congestion/rhinnorhea. Mouth/Throat: Mucous membranes are moist.   Cardiovascular: Normal rate, regular rhythm. Respiratory: Normal respiratory effort.  No retractions Abdomen: Soft nontender, hyperactive bowel sounds throughout all 4 quadrants GU: deferred Musculoskeletal: FROM all extremities, warm and well perfused Neurologic:  Normal speech and language.  Skin:  Skin is warm, dry and intact. No rash noted. Psychiatric: Mood and affect are normal. Speech and behavior are normal.  ____________________________________________   LABS (all labs ordered are listed, but only abnormal results are displayed)  Labs Reviewed  COMPREHENSIVE METABOLIC PANEL - Abnormal; Notable for the following components:      Result Value   Sodium 134 (*)    Chloride 100 (*)    AST 43 (*)    Total Bilirubin 1.6 (*)    All other components within normal limits  CBC WITH DIFFERENTIAL/PLATELET - Abnormal; Notable for the  following components:   Hemoglobin 19.2 (*)    HCT 55.1 (*)    Lymphs Abs 0.9 (*)    All other components within normal limits  LIPASE, BLOOD   ____________________________________________   ____________________________________________  RADIOLOGY    ____________________________________________   PROCEDURES  Procedure(s) performed: Saline lock, normal saline 1 L IV, CBC, complete metabolic panel, lipase  Procedures    ____________________________________________   INITIAL IMPRESSION / ASSESSMENT AND PLAN / ED COURSE  Pertinent labs & imaging results that were available during my care of the patient were reviewed by me and considered in my medical decision making (see chart for details).  Patient is 37 year old male presents emergency department complaining of nausea, vomiting or diarrhea for greater than 24 hours.  On physical exam the patient appears nontoxic.  Abdomen is soft and nontender.  Bowel sounds are hyperactive  The patient was given IV with 1 L normal saline, CBC, comprehensive metabolic panel and lipase are ordered   ----------------------------------------- 7:09 PM on 12/17/2017 -----------------------------------------  Labs are normal except for CBC shows polycythemia with high hematocrit and hemoglobin.  [place] lab findings with patient.  He states he is never been told he has polycythemia and does not know of anyone in his family that has anemias.  He was instructed to follow-up with hematology or his regular doctor to have a repeat CBC in 1 week.  Explained to him that due to dehydration from the vomiting and diarrhea may be a little more concentrated but that a level that high should be investigated.  States he understands will comply with instructions.  He was given a work note for today and tomorrow.  He was given a prescription for Zofran ODT.  He is to take over-the-counter Imodium for the diarrhea.  Brat diet was encouraged   As part of my  medical decision making, I reviewed the following data within the electronic MEDICAL RECORD NUMBER Nursing notes reviewed and incorporated, Labs reviewed ABC shows polycythemia the room remainder of the labs are normal, Old chart reviewed, Notes from prior ED visits and [place] Controlled Substance Database  ____________________________________________   FINAL CLINICAL IMPRESSION(S) / ED DIAGNOSES  Final diagnoses:  Acute gastroenteritis      NEW MEDICATIONS STARTED DURING THIS VISIT:  New Prescriptions   ONDANSETRON (ZOFRAN-ODT) 4 MG DISINTEGRATING TABLET    Take 1 tablet (4 mg total) by mouth every 8 (eight) hours as needed for nausea or vomiting.     Note:  This document was prepared using Dragon voice recognition software and may include unintentional dictation errors.    Faythe Ghee, PA-C 12/17/17 1912    Sharman Cheek, MD 12/18/17 2241

## 2017-12-19 ENCOUNTER — Encounter: Payer: Self-pay | Admitting: Emergency Medicine

## 2017-12-19 ENCOUNTER — Emergency Department
Admission: EM | Admit: 2017-12-19 | Discharge: 2017-12-19 | Disposition: A | Discharge status: Home / Self Care | Payer: Managed Care, Other (non HMO) | Attending: Emergency Medicine | Admitting: Emergency Medicine

## 2017-12-19 ENCOUNTER — Emergency Department: Payer: Managed Care, Other (non HMO)

## 2017-12-19 DIAGNOSIS — F17210 Nicotine dependence, cigarettes, uncomplicated: Secondary | ICD-10-CM | POA: Insufficient documentation

## 2017-12-19 DIAGNOSIS — R1110 Vomiting, unspecified: Secondary | ICD-10-CM

## 2017-12-19 DIAGNOSIS — R1011 Right upper quadrant pain: Secondary | ICD-10-CM | POA: Diagnosis not present

## 2017-12-19 DIAGNOSIS — R112 Nausea with vomiting, unspecified: Secondary | ICD-10-CM | POA: Insufficient documentation

## 2017-12-19 LAB — COMPREHENSIVE METABOLIC PANEL
ALK PHOS: 93 U/L (ref 38–126)
ALT: 34 U/L (ref 17–63)
ANION GAP: 10 (ref 5–15)
AST: 34 U/L (ref 15–41)
Albumin: 4.3 g/dL (ref 3.5–5.0)
BILIRUBIN TOTAL: 1.8 mg/dL — AB (ref 0.3–1.2)
BUN: 15 mg/dL (ref 6–20)
CALCIUM: 9.2 mg/dL (ref 8.9–10.3)
CO2: 23 mmol/L (ref 22–32)
Chloride: 101 mmol/L (ref 101–111)
Creatinine, Ser: 1.19 mg/dL (ref 0.61–1.24)
GFR calc Af Amer: >60 mL/min (ref >60)
Glucose, Bld: 117 mg/dL — ABNORMAL HIGH (ref 65–99)
Potassium: 4 mmol/L (ref 3.5–5.1)
Sodium: 134 mmol/L — ABNORMAL LOW (ref 135–145)
TOTAL PROTEIN: 7.5 g/dL (ref 6.5–8.1)

## 2017-12-19 LAB — CBC
HCT: 54.9 % — ABNORMAL HIGH (ref 40.0–52.0)
Hemoglobin: 19.1 g/dL — ABNORMAL HIGH (ref 13.0–18.0)
MCH: 33.7 pg (ref 26.0–34.0)
MCHC: 34.9 g/dL (ref 32.0–36.0)
MCV: 96.5 fL (ref 80.0–100.0)
Platelets: 167 K/uL (ref 150–440)
RBC: 5.69 MIL/uL (ref 4.40–5.90)
RDW: 13.5 % (ref 11.5–14.5)
WBC: 7.7 K/uL (ref 3.8–10.6)

## 2017-12-19 LAB — URINALYSIS, COMPLETE (UACMP) WITH MICROSCOPIC
Bilirubin Urine: NEGATIVE
Glucose, UA: NEGATIVE mg/dL
HGB URINE DIPSTICK: NEGATIVE
Ketones, ur: 20 mg/dL — AB
LEUKOCYTES UA: NEGATIVE
NITRITE: NEGATIVE
PH: 5 (ref 5.0–8.0)
Protein, ur: NEGATIVE mg/dL
Specific Gravity, Urine: 1.027 (ref 1.005–1.030)
Squamous Epithelial / LPF: NONE SEEN (ref 0–5)

## 2017-12-19 LAB — LIPASE, BLOOD: Lipase: 24 U/L (ref 11–51)

## 2017-12-19 MED ORDER — PROMETHAZINE HCL 12.5 MG PO TABS: 12.5000 mg | ORAL_TABLET | Freq: Four times a day (QID) | ORAL | 0 refills | Status: DC | PRN

## 2017-12-19 MED ORDER — PROMETHAZINE HCL 25 MG/ML IJ SOLN
12.5000 mg | Freq: Once | INTRAMUSCULAR | Status: AC
  Administered 2017-12-19: 12.5 mg via INTRAVENOUS
  Filled 2017-12-19: qty 1

## 2017-12-19 MED ORDER — SODIUM CHLORIDE 0.9 % IV BOLUS
1000.0000 mL | Freq: Once | INTRAVENOUS | Status: AC
  Administered 2017-12-19: 1000 mL via INTRAVENOUS

## 2017-12-19 NOTE — ED Notes (Addendum)
Pt c/o n/v and diarrhea x4 days. Pt reports taking zofran at home without any relief. Pt reports 3-5 episodes of emesis and diarrhea. Pt is unable to keep any food down at this time.

## 2017-12-19 NOTE — Discharge Instructions (Addendum)
he is follow closely with primary care doctor, her bilirubin is slightly elevated this is likely something that will pass to please have it rechecked, return to the emergency room for any new or worrisome symptoms including vomiting fever or worsening pain

## 2017-12-19 NOTE — ED Triage Notes (Signed)
Pt to ED with c/o vomiting and diarrhea. Pt seen here on 4/30 but states he is not getting any better. Pt states N/V/D continues and he is unable to keep anything down.

## 2017-12-19 NOTE — ED Provider Notes (Addendum)
[facility] Emergency Department Provider Note  ____________________________________________   I have reviewed the triage vital signs and the nursing notes. Where available I have reviewed prior notes and, if possible and indicated, outside hospital notes.    HISTORY  Chief Complaint No chief complaint on file.    HPI Mark Gordon is a 37 y.o. male   he was largely healthy, who presents today complaining of nausea vomiting diarrhea. He had it for a few days. He was seen here on the 30th at that time he is having watery diarrhea and vomiting. His vomiting is decreased is able to hold down some fluids including a sugary drink he had just prior to coming in however, he still is having watery diarrhea. Overall his symptoms are improving but he surprised that they're lasting this long and he wants to make sure nothing is okay. He denies any focal abdominal pain. He states he has cramping when he has diarrhea that relieves right afterwards. I did ask him if he could give me a stool sample he states that he is "about out of diarrhea" and actually hasn't had any significant diarrhea today. Overall and otherwise things are improving but he wants to be sure that nothing else is going on.      History reviewed. No pertinent past medical history.  There are no active problems to display for this patient.   Past Surgical History:  Procedure Laterality Date  . TYMPANOSTOMY TUBE PLACEMENT      Prior to Admission medications   Medication Sig Start Date End Date Taking? Authorizing Provider  guaiFENesin-codeine 100-10 MG/5ML syrup Take 5 mLs by mouth every 6 (six) hours as needed for cough. 09/24/17   Tommi Rumps, PA-C  ibuprofen (ADVIL,MOTRIN) 800 MG tablet Take 1 tablet (800 mg total) by mouth every 8 (eight) hours as needed. 09/26/17   Enid Derry, PA-C  ondansetron (ZOFRAN) 4 MG tablet Take 1 tablet (4 mg total) by mouth daily as needed for nausea or  vomiting. 09/26/17 09/26/18  Enid Derry, PA-C  ondansetron (ZOFRAN-ODT) 4 MG disintegrating tablet Take 1 tablet (4 mg total) by mouth every 8 (eight) hours as needed for nausea or vomiting. 12/17/17   Sherrie Mustache Roselyn Bering, PA-C    Allergies Patient has no known allergies.  History reviewed. No pertinent family history.  Social History Social History   Tobacco Use  . Smoking status: Current Every Day Smoker    Packs/day: 0.25    Types: Cigarettes  . Smokeless tobacco: Never Used  Substance Use Topics  . Alcohol use: Yes    Comment: occ. on weekends  . Drug use: No    Review of Systems Constitutional: No fever/chills Eyes: No visual changes. ENT: No sore throat. No stiff neck no neck pain Cardiovascular: Denies chest pain. Respiratory: Denies shortness of breath. Gastrointestinal:   see history of present illness  No constipation. Genitourinary: Negative for dysuria. Musculoskeletal: Negative lower extremity swelling Skin: Negative for rash. Neurological: Negative for severe headaches, focal weakness or numbness.   ____________________________________________   PHYSICAL EXAM:  VITAL SIGNS: ED Triage Vitals  Enc Vitals Group     BP 12/19/17 1436 118/85     Pulse Rate 12/19/17 1436 80     Resp 12/19/17 1436 16     Temp 12/19/17 1436 97.9 F (36.6 C)     Temp Source 12/19/17 1436 Oral     SpO2 12/19/17 1436 98 %     Weight --  Height --      Head Circumference --      Peak Flow --      Pain Score 12/19/17 1437 8     Pain Loc --      Pain Edu? --      Excl. in GC? --     Constitutional: Alert and oriented. Well appearing and in no acute distress. Eyes: Conjunctivae are normal Head: Atraumatic HEENT: No congestion/rhinnorhea. Mucous membranes are moist.  Oropharynx non-erythematous Neck:   Nontender with no meningismus, no masses, no stridor Cardiovascular: Normal rate, regular rhythm. Grossly normal heart sounds.  Good peripheral circulation. Respiratory:  Normal respiratory effort.  No retractions. Lungs CTAB. Abdominal: Soft and nontender. No distention. No guarding no rebound Back:  There is no focal tenderness or step off.  there is no midline tenderness there are no lesions noted. there is no CVA tenderness Musculoskeletal: No lower extremity tenderness, no upper extremity tenderness. No joint effusions, no DVT signs strong distal pulses no edema Neurologic:  Normal speech and language. No gross focal neurologic deficits are appreciated.  Skin:  Skin is warm, dry and intact. No rash noted. Psychiatric: Mood and affect are normal. Speech and behavior are normal.  ____________________________________________   LABS (all labs ordered are listed, but only abnormal results are displayed)  Labs Reviewed  CBC - Abnormal; Notable for the following components:      Result Value   Hemoglobin 19.1 (*)    HCT 54.9 (*)    All other components within normal limits  COMPREHENSIVE METABOLIC PANEL - Abnormal; Notable for the following components:   Sodium 134 (*)    Glucose, Bld 117 (*)    Total Bilirubin 1.8 (*)    All other components within normal limits  URINALYSIS, COMPLETE (UACMP) WITH MICROSCOPIC - Abnormal; Notable for the following components:   Color, Urine AMBER (*)    APPearance CLEAR (*)    Ketones, ur 20 (*)    Bacteria, UA RARE (*)    All other components within normal limits  GASTROINTESTINAL PANEL BY PCR, STOOL (REPLACES STOOL CULTURE)  C DIFFICILE QUICK SCREEN W PCR REFLEX  LIPASE, BLOOD    Pertinent labs  results that were available during my care of the patient were reviewed by me and considered in my medical decision making (see chart for details). ____________________________________________  EKG  I personally interpreted any EKGs ordered by me or triage  ____________________________________________  RADIOLOGY  Pertinent labs & imaging results that were available during my care of the patient were reviewed by  me and considered in my medical decision making (see chart for details). If possible, patient and/or family made aware of any abnormal findings.  US Abdomen Limited Ruq  Result Date: 12/19/2017 CLINICAL DATA:  Vomiting.  Diarrhea. EXAM: ULTRASOUND ABDOMEN LIMITED RIGHT UPPER QUADRANT COMPARISON:  No recent. FINDINGS: Gallbladder: No gallstones. Gallbladder wall thickness normal. Negative Murphy's. Common bile duct: Diameter: 3.3 mm Liver: No focal lesion identified. Within normal limits in parenchymal echogenicity. Portal vein is patent on color Doppler imaging with normal direction of blood flow towards the liver. IMPRESSION: No acute or focal abnormality. Electronically Signed   By: Maisie Fus  Register   On: 12/19/2017 16:58   ____________________________________________    PROCEDURES  Procedure(s) performed: None  Procedures  Critical Care performed: None  ____________________________________________   INITIAL IMPRESSION / ASSESSMENT AND PLAN / ED COURSE  Pertinent labs & imaging results that were available during my care of the patient were reviewed by  me and considered in my medical decision making (see chart for details).  patient with nausea vomiting diarrhea his bilirubin is slightly up he does not have any significant focal right upper quadrant tenderness however we will obtain an ultrasound as precaution given that he is back again. Overall though he does have signs and symptoms consistent with viral disease and he also is getting markedly better he states. He is here for reassurance that this is a normal course of present which I have assured him he can be. There is a very large bili burden of a similar GI bug and he does have sick contacts. No warning signs are noted in his abdomen on my exam. We will give him IV fluids again, and we will reassess. ,aside from his bilirubin he has largelyreassuring and normal blood work, again we'll do an ultrasound as a precaution is clear with  slightly up and only slightly. That is negative is my hope that with IV fluids and reassurance can go home we will continue serial abdominal exams while he is here.  ----------------------------------------- 6:06 PM on 12/19/2017 -----------------------------------------  abdominal exam remains very reassuring, abdomen is completely benign, no focal tenderness ultrasound is negative, bilirubin is incidental patient made aware of the need to follow-up, there is no evidence of acute intra-abdominal pathology, he has not vomited here he is tolerating by mouth he has had no diarrhea and not able to give [place] a sample, all this is positive I think, return precautions were given and understood patient patient very comfortable with plan. Requesting work note.   ____________________________________________   FINAL CLINICAL IMPRESSION(S) / ED DIAGNOSES  Final diagnoses:  Vomiting      This chart was dictated using voice recognition software.  Despite best efforts to proofread,  errors can occur which can change meaning.      Jeanmarie Plant, MD 12/19/17 1708    Jeanmarie Plant, MD 12/19/17 1807    Jeanmarie Plant, MD 12/19/17 1807    Jeanmarie Plant, MD 12/19/17 1807

## 2018-01-07 ENCOUNTER — Encounter: Payer: Self-pay | Admitting: Emergency Medicine

## 2018-01-07 ENCOUNTER — Other Ambulatory Visit: Payer: Self-pay

## 2018-01-07 ENCOUNTER — Emergency Department
Admission: EM | Admit: 2018-01-07 | Discharge: 2018-01-07 | Disposition: A | Discharge status: Home / Self Care | Payer: Managed Care, Other (non HMO) | Attending: Emergency Medicine | Admitting: Emergency Medicine

## 2018-01-07 ENCOUNTER — Emergency Department: Payer: Managed Care, Other (non HMO)

## 2018-01-07 DIAGNOSIS — S3992XA Unspecified injury of lower back, initial encounter: Secondary | ICD-10-CM | POA: Diagnosis present

## 2018-01-07 DIAGNOSIS — Y999 Unspecified external cause status: Secondary | ICD-10-CM | POA: Insufficient documentation

## 2018-01-07 DIAGNOSIS — Y93E6 Activity, residential relocation: Secondary | ICD-10-CM | POA: Diagnosis not present

## 2018-01-07 DIAGNOSIS — F17210 Nicotine dependence, cigarettes, uncomplicated: Secondary | ICD-10-CM | POA: Insufficient documentation

## 2018-01-07 DIAGNOSIS — S39012A Strain of muscle, fascia and tendon of lower back, initial encounter: Secondary | ICD-10-CM | POA: Insufficient documentation

## 2018-01-07 DIAGNOSIS — X500XXA Overexertion from strenuous movement or load, initial encounter: Secondary | ICD-10-CM | POA: Insufficient documentation

## 2018-01-07 DIAGNOSIS — Y929 Unspecified place or not applicable: Secondary | ICD-10-CM | POA: Diagnosis not present

## 2018-01-07 MED ORDER — MELOXICAM 15 MG PO TABS: 15.0000 mg | ORAL_TABLET | Freq: Every day | ORAL | 1 refills | Status: DC

## 2018-01-07 MED ORDER — CYCLOBENZAPRINE HCL 10 MG PO TABS: 10.0000 mg | ORAL_TABLET | Freq: Three times a day (TID) | ORAL | 0 refills | Status: DC | PRN

## 2018-01-07 MED ORDER — KETOROLAC TROMETHAMINE 30 MG/ML IJ SOLN
30.0000 mg | Freq: Once | INTRAMUSCULAR | Status: AC
  Administered 2018-01-07: 30 mg via INTRAMUSCULAR
  Filled 2018-01-07: qty 1

## 2018-01-07 MED ORDER — METHOCARBAMOL 500 MG PO TABS
1000.0000 mg | ORAL_TABLET | Freq: Once | ORAL | Status: AC
  Administered 2018-01-07: 1000 mg via ORAL
  Filled 2018-01-07: qty 2

## 2018-01-07 MED ORDER — MELOXICAM 15 MG PO TABS: 15.0000 mg | ORAL_TABLET | Freq: Every day | ORAL | 1 refills | Status: AC

## 2018-01-07 MED ORDER — CYCLOBENZAPRINE HCL 10 MG PO TABS: 10.0000 mg | ORAL_TABLET | Freq: Three times a day (TID) | ORAL | 0 refills | Status: AC | PRN

## 2018-01-07 NOTE — ED Notes (Signed)
Pt reports that he is having lower back pain that radiates into the middle of his back - pain has been present for 2-3 days but was worse today - denies pain/diff with urination - pt reports that over the weekend he helped some friends move and that potentially he pulled a muscle

## 2018-01-07 NOTE — ED Triage Notes (Signed)
Pt reports that he is having lower back pain that radiates up his back. States that he was helping his friends move this weekend and thinks that's when he hurt it. No urinary symptoms.

## 2018-01-07 NOTE — ED Provider Notes (Signed)
[facility] Emergency Department Provider Note  ____________________________________________  Time seen: Approximately 5:52 PM  I have reviewed the triage vital signs and the nursing notes.   HISTORY  Chief Complaint Back Pain    HPI Mark Gordon is a 37 y.o. male presents to the emergency department with low back pain.  Patient reports that his pain is currently 8 out of 10 in intensity without radiculopathy.  Patient reports that he lifted heavy furniture over the weekend.  He denies weakness or changes in sensation of the lower extremities.  No alleviating medications have been attempted.  History reviewed. No pertinent past medical history.  There are no active problems to display for this patient.   Past Surgical History:  Procedure Laterality Date  . TYMPANOSTOMY TUBE PLACEMENT      Prior to Admission medications   Medication Sig Start Date End Date Taking? Authorizing Provider  cyclobenzaprine (FLEXERIL) 10 MG tablet Take 1 tablet (10 mg total) by mouth 3 (three) times daily as needed for up to 5 days. 01/07/18 01/12/18  Orvil Feil, PA-C  guaiFENesin-codeine 100-10 MG/5ML syrup Take 5 mLs by mouth every 6 (six) hours as needed for cough. 09/24/17   Tommi Rumps, PA-C  ibuprofen (ADVIL,MOTRIN) 800 MG tablet Take 1 tablet (800 mg total) by mouth every 8 (eight) hours as needed. 09/26/17   Enid Derry, PA-C  meloxicam (MOBIC) 15 MG tablet Take 1 tablet (15 mg total) by mouth daily for 7 days. 01/07/18 01/14/18  Orvil Feil, PA-C  ondansetron (ZOFRAN) 4 MG tablet Take 1 tablet (4 mg total) by mouth daily as needed for nausea or vomiting. 09/26/17 09/26/18  Enid Derry, PA-C  ondansetron (ZOFRAN-ODT) 4 MG disintegrating tablet Take 1 tablet (4 mg total) by mouth every 8 (eight) hours as needed for nausea or vomiting. 12/17/17   Sherrie Mustache Roselyn Bering, PA-C  promethazine (PHENERGAN) 12.5 MG tablet Take 1 tablet (12.5 mg total) by mouth every 6 (six)  hours as needed for nausea or vomiting. 12/19/17   Jeanmarie Plant, MD    Allergies Patient has no known allergies.  History reviewed. No pertinent family history.  Social History Social History   Tobacco Use  . Smoking status: Current Every Day Smoker    Packs/day: 0.25    Types: Cigarettes  . Smokeless tobacco: Never Used  Substance Use Topics  . Alcohol use: Yes    Comment: occ. on weekends  . Drug use: No     Review of Systems  Constitutional: No fever/chills Eyes: No visual changes. No discharge ENT: No upper respiratory complaints. Cardiovascular: no chest pain. Respiratory: no cough. No SOB. Gastrointestinal: No abdominal pain.  No nausea, no vomiting.  No diarrhea.  No constipation. Musculoskeletal: Patient has low back pain.  Skin: Negative for rash, abrasions, lacerations, ecchymosis. Neurological: Negative for headaches, focal weakness or numbness. ____________________________________________   PHYSICAL EXAM:  VITAL SIGNS: ED Triage Vitals [01/07/18 1643]  Enc Vitals Group     BP      Pulse      Resp      Temp      Temp src      SpO2      Weight      Height      Head Circumference      Peak Flow      Pain Score 9     Pain Loc      Pain Edu?      Excl. in  GC?      Constitutional: Alert and oriented. Well appearing and in no acute distress. Eyes: Conjunctivae are normal. PERRL. EOMI. Head: Atraumatic. Cardiovascular: Normal rate, regular rhythm. Normal S1 and S2.  Good peripheral circulation. Respiratory: Normal respiratory effort without tachypnea or retractions. Lungs CTAB. Good air entry to the bases with no decreased or absent breath sounds. Musculoskeletal: Full range of motion to all extremities. No gross deformities appreciated.  Patient has paraspinal muscle tenderness to palpation along the lumbar spine. Neurologic:  Normal speech and language. No gross focal neurologic deficits are appreciated.  Skin:  Skin is warm, dry and intact. No  rash noted. Psychiatric: Mood and affect are normal. Speech and behavior are normal. Patient exhibits appropriate insight and judgement.   ____________________________________________   LABS (all labs ordered are listed, but only abnormal results are displayed)  Labs Reviewed - No data to display ____________________________________________  EKG   ____________________________________________  RADIOLOGY Geraldo Pitter, personally viewed and evaluated these images (plain radiographs) as part of my medical decision making, as well as reviewing the written report by the radiologist.  Dg Lumbar Spine 2-3 Views  Result Date: 01/07/2018 CLINICAL DATA:  Lower back pain EXAM: LUMBAR SPINE - 2-3 VIEW COMPARISON:  None. FINDINGS: There is no evidence of lumbar spine fracture. Alignment is normal. Intervertebral disc spaces are maintained. IMPRESSION: Negative. Electronically Signed   By: Tollie Eth M.D.   On: 01/07/2018 17:31    ____________________________________________    PROCEDURES  Procedure(s) performed:    Procedures    Medications  methocarbamol (ROBAXIN) tablet 1,000 mg (1,000 mg Oral Given 01/07/18 1759)  ketorolac (TORADOL) 30 MG/ML injection 30 mg (30 mg Intramuscular Given 01/07/18 1759)     ____________________________________________   INITIAL IMPRESSION / ASSESSMENT AND PLAN / ED COURSE  Pertinent labs & imaging results that were available during my care of the patient were reviewed by me and considered in my medical decision making (see chart for details).  Review of the [place] CSRS was performed in accordance of the [facility] prior to dispensing any controlled drugs.     Assessment and plan Lumbar strain Patient presents to the emergency department with low back pain after an episode of heavy lifting.  Differential diagnosis included compression fracture versus lumbar strain.  No acute fractures were identified on x-ray examination of the lumbar spine.   Patient was given Robaxin and Toradol in the emergency department.  He was discharged with meloxicam and Robaxin.  Vital signs are reassuring prior to discharge.  All patient questions were answered.    ____________________________________________  FINAL CLINICAL IMPRESSION(S) / ED DIAGNOSES  Final diagnoses:  Strain of lumbar region, initial encounter      NEW MEDICATIONS STARTED DURING THIS VISIT:  ED Discharge Orders        Ordered    meloxicam (MOBIC) 15 MG tablet  Daily,   Status:  Discontinued     01/07/18 1828    cyclobenzaprine (FLEXERIL) 10 MG tablet  3 times daily PRN,   Status:  Discontinued     01/07/18 1828    cyclobenzaprine (FLEXERIL) 10 MG tablet  3 times daily PRN     01/07/18 1830    meloxicam (MOBIC) 15 MG tablet  Daily     01/07/18 1830          This chart was dictated using voice recognition software/Dragon. Despite best efforts to proofread, errors can occur which can change the meaning. Any change was purely unintentional.  Pia Mau Barnett, PA-C 01/07/18 1859    Minna Antis, MD 01/07/18 2240

## 2018-02-25 ENCOUNTER — Other Ambulatory Visit: Payer: Self-pay

## 2018-02-25 ENCOUNTER — Emergency Department
Admission: EM | Admit: 2018-02-25 | Discharge: 2018-02-25 | Disposition: A | Discharge status: Home / Self Care | Payer: Managed Care, Other (non HMO) | Attending: Emergency Medicine | Admitting: Emergency Medicine

## 2018-02-25 ENCOUNTER — Encounter: Payer: Self-pay | Admitting: Emergency Medicine

## 2018-02-25 DIAGNOSIS — R109 Unspecified abdominal pain: Secondary | ICD-10-CM | POA: Diagnosis present

## 2018-02-25 DIAGNOSIS — B349 Viral infection, unspecified: Secondary | ICD-10-CM | POA: Diagnosis not present

## 2018-02-25 DIAGNOSIS — R112 Nausea with vomiting, unspecified: Secondary | ICD-10-CM

## 2018-02-25 DIAGNOSIS — F17210 Nicotine dependence, cigarettes, uncomplicated: Secondary | ICD-10-CM | POA: Diagnosis not present

## 2018-02-25 LAB — CBC
HEMATOCRIT: 53.6 % — AB (ref 40.0–52.0)
HEMOGLOBIN: 18.7 g/dL — AB (ref 13.0–18.0)
MCH: 33.5 pg (ref 26.0–34.0)
MCHC: 34.9 g/dL (ref 32.0–36.0)
MCV: 96.1 fL (ref 80.0–100.0)
Platelets: 171 10*3/uL (ref 150–440)
RBC: 5.58 MIL/uL (ref 4.40–5.90)
RDW: 13 % (ref 11.5–14.5)
WBC: 7.3 10*3/uL (ref 3.8–10.6)

## 2018-02-25 LAB — COMPREHENSIVE METABOLIC PANEL
ALBUMIN: 4.3 g/dL (ref 3.5–5.0)
ALK PHOS: 123 U/L (ref 38–126)
ALT: 33 U/L (ref 0–44)
ANION GAP: 11 (ref 5–15)
AST: 32 U/L (ref 15–41)
BUN: 15 mg/dL (ref 6–20)
CO2: 26 mmol/L (ref 22–32)
Calcium: 9.7 mg/dL (ref 8.9–10.3)
Chloride: 99 mmol/L (ref 98–111)
Creatinine, Ser: 1.36 mg/dL — ABNORMAL HIGH (ref 0.61–1.24)
GFR calc Af Amer: >60 mL/min (ref >60)
GFR calc non Af Amer: >60 mL/min (ref >60)
GLUCOSE: 160 mg/dL — AB (ref 70–99)
Potassium: 4.3 mmol/L (ref 3.5–5.1)
Sodium: 136 mmol/L (ref 135–145)
Total Bilirubin: 1.2 mg/dL (ref 0.3–1.2)
Total Protein: 7.9 g/dL (ref 6.5–8.1)

## 2018-02-25 LAB — LIPASE, BLOOD: LIPASE: 27 U/L (ref 11–51)

## 2018-02-25 LAB — CK: Total CK: 126 U/L (ref 49–397)

## 2018-02-25 MED ORDER — ONDANSETRON HCL 4 MG PO TABS: 4.0000 mg | ORAL_TABLET | Freq: Every day | ORAL | 0 refills | Status: DC | PRN

## 2018-02-25 MED ORDER — ONDANSETRON HCL 4 MG PO TABS
4.0000 mg | ORAL_TABLET | Freq: Once | ORAL | Status: AC
  Administered 2018-02-25: 4 mg via ORAL
  Filled 2018-02-25: qty 1

## 2018-02-25 NOTE — ED Notes (Signed)
Pt unable to urinate at this time, given specimen cup for when is able to void. Pt sent back out to the lobby. 

## 2018-02-25 NOTE — ED Triage Notes (Signed)
PT to ED via POV with c/o abd pain and headache that started yesterday , decreased appetite. PT in NAD at this time, VSS , pt ambulatory

## 2018-02-25 NOTE — ED Provider Notes (Addendum)
[facility] Emergency Department Provider Note ____________________________________________   [facility] 02/25/18 1941     (approximate)  I have reviewed the triage vital signs and the nursing notes.   HISTORY  Chief Complaint Abdominal Pain   HPI Mark Gordon is a 37 y.o. male without any chronic medical conditions was presenting to the emergency department today with 1 week of nasal congestion and now nausea, vomiting as well as body aches over the past 2 days.  He says that he has a headache as well as diffuse abdominal pain as well.  Denies any diarrhea.  Denies any known sick contacts.  Says that he works in a [employer].  Says that he has had multiple episodes of vomiting over the past several days but just prior to being placed in the exam room he was able to tolerate a soda.   History reviewed. No pertinent past medical history.  There are no active problems to display for this patient.   Past Surgical History:  Procedure Laterality Date  . TYMPANOSTOMY TUBE PLACEMENT      Prior to Admission medications   Medication Sig Start Date End Date Taking? Authorizing Provider  guaiFENesin-codeine 100-10 MG/5ML syrup Take 5 mLs by mouth every 6 (six) hours as needed for cough. 09/24/17   Tommi RumpsSummers, Rhonda L, PA-C  ibuprofen (ADVIL,MOTRIN) 800 MG tablet Take 1 tablet (800 mg total) by mouth every 8 (eight) hours as needed. 09/26/17   Enid DerryWagner, Ashley, PA-C  ondansetron (ZOFRAN) 4 MG tablet Take 1 tablet (4 mg total) by mouth daily as needed for nausea or vomiting. 09/26/17 09/26/18  Enid DerryWagner, Ashley, PA-C  ondansetron (ZOFRAN-ODT) 4 MG disintegrating tablet Take 1 tablet (4 mg total) by mouth every 8 (eight) hours as needed for nausea or vomiting. 12/17/17   Sherrie MustacheFisher, Roselyn BeringSusan W, PA-C  promethazine (PHENERGAN) 12.5 MG tablet Take 1 tablet (12.5 mg total) by mouth every 6 (six) hours as needed for nausea or vomiting. 12/19/17   Jeanmarie PlantMcShane, James  A, MD    Allergies Patient has no known allergies.  No family history on file.  Social History Social History   Tobacco Use  . Smoking status: Current Every Day Smoker    Packs/day: 0.25    Types: Cigarettes  . Smokeless tobacco: Never Used  Substance Use Topics  . Alcohol use: Yes    Comment: occ. on weekends  . Drug use: No    Review of Systems  Constitutional: No fever/chills Eyes: No visual changes. ENT: No sore throat. Cardiovascular: Denies chest pain. Respiratory: Denies shortness of breath. Gastrointestinal:   No diarrhea.  No constipation. Genitourinary: Negative for dysuria. Musculoskeletal: Negative for back pain. Skin: Negative for rash. Neurological: Negative for focal weakness or numbness.   ____________________________________________   PHYSICAL EXAM:  VITAL SIGNS: ED Triage Vitals  Enc Vitals Group     BP 02/25/18 1759 (!) 155/97     Pulse Rate 02/25/18 1759 91     Resp 02/25/18 1759 18     Temp 02/25/18 1759 98.4 F (36.9 C)     Temp Source 02/25/18 1759 Oral     SpO2 02/25/18 1759 97 %     Weight 02/25/18 1759 240 lb (108.9 kg)     Height 02/25/18 1759 6\' 2"  (1.88 m)     Head Circumference --      Peak Flow --      Pain Score 02/25/18 1801 9     Pain Loc --  Pain Edu? --      Excl. in GC? --     Constitutional: Alert and oriented. Well appearing and in no acute distress. Eyes: Conjunctivae are normal.  Head: Atraumatic. Nose: No congestion/rhinnorhea. Mouth/Throat: Mucous membranes are moist.  No pharyngeal erythema.  No tonsillar no uvular swelling.   Neck: No stridor.   Cardiovascular: Normal rate, regular rhythm. Grossly normal heart sounds.   Respiratory: Normal respiratory effort.  No retractions. Lungs CTAB. Gastrointestinal: Soft and nontender. No distention. No CVA tenderness. Musculoskeletal: No lower extremity tenderness nor edema.  No joint effusions. Neurologic:  Normal speech and language. No gross focal  neurologic deficits are appreciated. Skin:  Skin is warm, dry and intact. No rash noted. Psychiatric: Mood and affect are normal. Speech and behavior are normal.  ____________________________________________   LABS (all labs ordered are listed, but only abnormal results are displayed)  Labs Reviewed  COMPREHENSIVE METABOLIC PANEL - Abnormal; Notable for the following components:      Result Value   Glucose, Bld 160 (*)    Creatinine, Ser 1.36 (*)    All other components within normal limits  CBC - Abnormal; Notable for the following components:   Hemoglobin 18.7 (*)    HCT 53.6 (*)    All other components within normal limits  LIPASE, BLOOD  CK  URINALYSIS, COMPLETE (UACMP) WITH MICROSCOPIC   ____________________________________________  EKG   ____________________________________________  RADIOLOGY   ____________________________________________   PROCEDURES  Procedure(s) performed:   Procedures  Critical Care performed:   ____________________________________________   INITIAL IMPRESSION / ASSESSMENT AND PLAN / ED COURSE  Pertinent labs & imaging results that were available during my care of the patient were reviewed by me and considered in my medical decision making (see chart for details).  DDX: Viral syndrome, enteritis, new onset diabetes, rhabdomyolysis, kidney failure, URI As part of my medical decision making, I reviewed the following data within the electronic MEDICAL RECORD NUMBER Notes from prior ED visits  ----------------------------------------- 9:03 PM on 02/25/2018 -----------------------------------------  Patient given Zofran tab.  No vomiting in the emergency department.  I discussed with him his lab results including a glucose of 160.  This is not a fasting glucose.  Possible stress reaction.  I recommend that he follow-up in the office for further glucose testing.  We discussed that this could be a stress reaction versus early onset of  diabetes.  Also possible dehydration from nausea and vomiting with his creatinine of 1.36.  We will discharge him home with Zofran.  Patient understanding of the diagnosis as well as treatment plan willing to comply. ____________________________________________   FINAL CLINICAL IMPRESSION(S) / ED DIAGNOSES  Viral syndrome.  Nausea and vomiting.    NEW MEDICATIONS STARTED DURING THIS VISIT:  New Prescriptions   No medications on file     Note:  This document was prepared using Dragon voice recognition software and may include unintentional dictation errors.     Myrna Blazer, MD 02/25/18 2104    Myrna Blazer, MD 02/25/18 2104

## 2018-02-25 NOTE — ED Notes (Signed)
Pt verbalized he is still unable to provide a urine sample at this time. Pt has specimen cup.

## 2018-02-25 NOTE — ED Notes (Signed)
Lab notified to add on CK level 

## 2018-08-26 ENCOUNTER — Emergency Department
Admission: EM | Admit: 2018-08-26 | Discharge: 2018-08-26 | Disposition: A | Discharge status: Home / Self Care | Payer: Managed Care, Other (non HMO) | Attending: Emergency Medicine | Admitting: Emergency Medicine

## 2018-08-26 ENCOUNTER — Encounter: Payer: Self-pay | Admitting: Emergency Medicine

## 2018-08-26 DIAGNOSIS — F17210 Nicotine dependence, cigarettes, uncomplicated: Secondary | ICD-10-CM | POA: Insufficient documentation

## 2018-08-26 DIAGNOSIS — J069 Acute upper respiratory infection, unspecified: Secondary | ICD-10-CM

## 2018-08-26 DIAGNOSIS — R05 Cough: Secondary | ICD-10-CM | POA: Insufficient documentation

## 2018-08-26 DIAGNOSIS — R070 Pain in throat: Secondary | ICD-10-CM | POA: Insufficient documentation

## 2018-08-26 LAB — INFLUENZA PANEL BY PCR (TYPE A & B)
INFLAPCR: NEGATIVE
INFLBPCR: NEGATIVE

## 2018-08-26 MED ORDER — AZITHROMYCIN 250 MG PO TABS: ORAL_TABLET | ORAL | 0 refills | Status: DC

## 2018-08-26 MED ORDER — BENZONATATE 200 MG PO CAPS: 200.0000 mg | ORAL_CAPSULE | Freq: Three times a day (TID) | ORAL | 0 refills | Status: DC | PRN

## 2018-08-26 NOTE — ED Provider Notes (Signed)
[facility] Emergency Department Provider Note  ____________________________________________   First MD Initiated Contact with Patient 08/26/18 1437     (approximate)  I have reviewed the triage vital signs and the nursing notes.   HISTORY  Chief Complaint Nasal Congestion; Fever; Cough; and Generalized Body Aches    HPI Mark BalzarineMark Calvin Gordon is a 38 y.o. male flulike symptoms, patient is complained of body aches, cough, sore throat, unsure of fever denies vomiting, denies diarrhea; denies chest pain or sob.  Sx for 2 days   History reviewed. No pertinent past medical history.  There are no active problems to display for this patient.   Past Surgical History:  Procedure Laterality Date  . TYMPANOSTOMY TUBE PLACEMENT      Prior to Admission medications   Medication Sig Start Date End Date Taking? Authorizing Provider  azithromycin (ZITHROMAX Z-PAK) 250 MG tablet 2 pills today then 1 pill a day for 4 days 08/26/18   Sherrie MustacheFisher, Roselyn Bering W, PA-C  benzonatate (TESSALON) 200 MG capsule Take 1 capsule (200 mg total) by mouth 3 (three) times daily as needed for cough. 08/26/18   Faythe GheeFisher,  W, PA-C    Allergies Patient has no known allergies.  No family history on file.  Social History Social History   Tobacco Use  . Smoking status: Current Every Day Smoker    Packs/day: 0.25    Types: Cigarettes  . Smokeless tobacco: Never Used  Substance Use Topics  . Alcohol use: Yes    Comment: occ. on weekends  . Drug use: No    Review of Systems  Constitutional: Unsure of fever/chills Eyes: No visual changes. ENT: Positive sore throat. Respiratory: Positive cough Genitourinary: Negative for dysuria. Musculoskeletal: Negative for back pain. Skin: Negative for rash.    ____________________________________________   PHYSICAL EXAM:  VITAL SIGNS: ED Triage Vitals  Enc Vitals Group     BP 08/26/18 1421 (!) 145/79     Pulse Rate 08/26/18 1421 68   Resp 08/26/18 1421 16     Temp 08/26/18 1421 97.9 F (36.6 C)     Temp Source 08/26/18 1421 Oral     SpO2 08/26/18 1421 99 %     Weight 08/26/18 1418 225 lb (102.1 kg)     Height 08/26/18 1418 6' (1.829 m)     Head Circumference --      Peak Flow --      Pain Score 08/26/18 1418 8     Pain Loc --      Pain Edu? --      Excl. in GC? --     Constitutional: Alert and oriented. Well appearing and in no acute distress. Eyes: Conjunctivae are normal.  Head: Atraumatic. Nose: No congestion/rhinnorhea. Mouth/Throat: Mucous membranes are moist.  Throat is irritated Neck:  supple no lymphadenopathy noted Cardiovascular: Normal rate, regular rhythm. Heart sounds are normal Respiratory: Normal respiratory effort.  No retractions, lungs c t a  GU: deferred Musculoskeletal: FROM all extremities, warm and well perfused Neurologic:  Normal speech and language.  Skin:  Skin is warm, dry and intact. No rash noted. Psychiatric: Mood and affect are normal. Speech and behavior are normal.  ____________________________________________   LABS (all labs ordered are listed, but only abnormal results are displayed)  Labs Reviewed  INFLUENZA PANEL BY PCR (TYPE A & B)   ____________________________________________   ____________________________________________  RADIOLOGY    ____________________________________________   PROCEDURES  Procedure(s) performed: No  Procedures    ____________________________________________  INITIAL IMPRESSION / ASSESSMENT AND PLAN / ED COURSE  Pertinent labs & imaging results that were available during my care of the patient were reviewed by me and considered in my medical decision making (see chart for details).   Patient is 38 year old male presents emergency department flulike symptoms.  Physical exam shows an afebrile nontoxic male.  Irritated throat.  Dry cough.  Flu test is negative  Explained test results to the patient.  Explained to him  that he has an upper respiratory infection.  A Z-Pak and Tessalon Perles were sent to his pharmacy.  Work note was given for today and tomorrow.  He is to follow-up with [facility] regular [facility] not better in 3 days.  Return emergency department worsening.  States he understands will comply.  Is discharged stable condition.     As part of my medical decision making, I reviewed the following data within the electronic MEDICAL RECORD NUMBER Nursing notes reviewed and incorporated, Labs reviewed flu test negative, Notes from prior ED visits and [place] Controlled Substance Database  ____________________________________________   FINAL CLINICAL IMPRESSION(S) / ED DIAGNOSES  Final diagnoses:  Acute URI      NEW MEDICATIONS STARTED DURING THIS VISIT:  New Prescriptions   AZITHROMYCIN (ZITHROMAX Z-PAK) 250 MG TABLET    2 pills today then 1 pill a day for 4 days   BENZONATATE (TESSALON) 200 MG CAPSULE    Take 1 capsule (200 mg total) by mouth 3 (three) times daily as needed for cough.     Note:  This document was prepared using Dragon voice recognition software and may include unintentional dictation errors.    Faythe Ghee, PA-C 08/26/18 1521    Schaevitz, Myra Rude, MD 08/26/18 [phone]

## 2018-08-26 NOTE — ED Notes (Signed)
Influenza swab sent to lab

## 2018-08-26 NOTE — ED Triage Notes (Signed)
Pt reports fevers, cough, congestion and body aches since Sunday.

## 2018-08-26 NOTE — Discharge Instructions (Addendum)
Follow-up with your regular doctor if not better in 3 days.  Return emergency department worsening.  Use medication as prescribed. 

## 2019-08-02 IMAGING — US US ABDOMEN LIMITED
1 series · 14 of 25 positions shown · non-contrast
Comparison: No recent.

CLINICAL DATA: Vomiting.  Diarrhea.

EXAM:
ULTRASOUND ABDOMEN LIMITED RIGHT UPPER QUADRANT

[Series 1: us abdomen limited · 0.23mm/px · 14 of 33 slices shown]
[im 1/33]
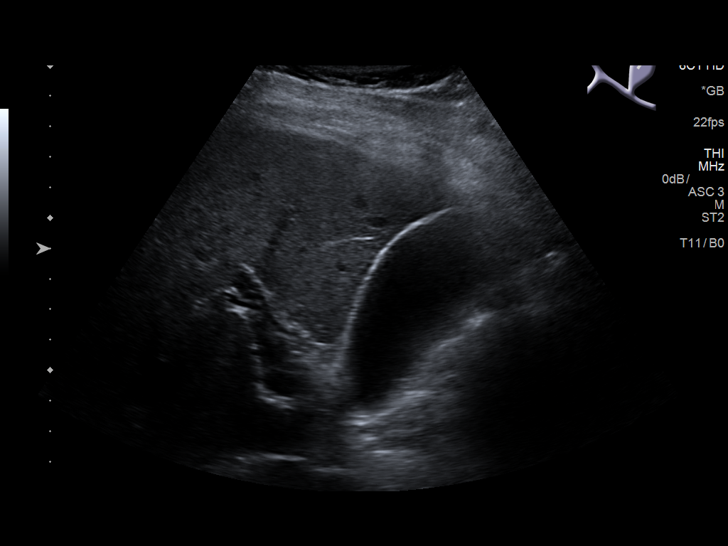
[im 3/33]
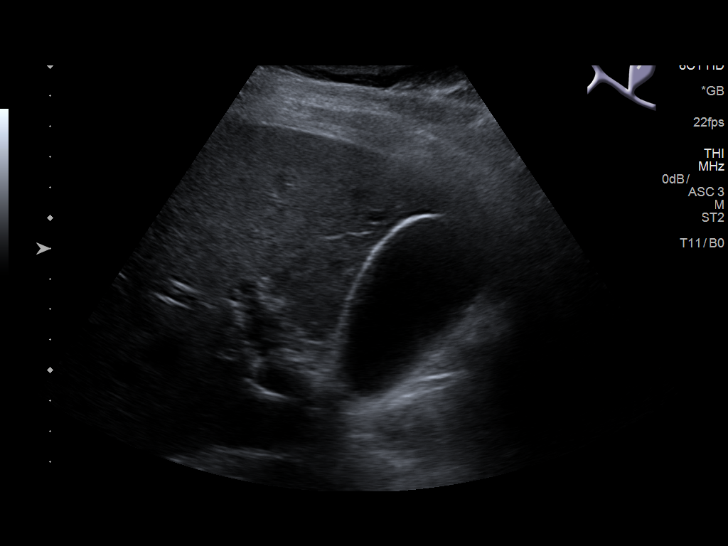
[im 6/33]
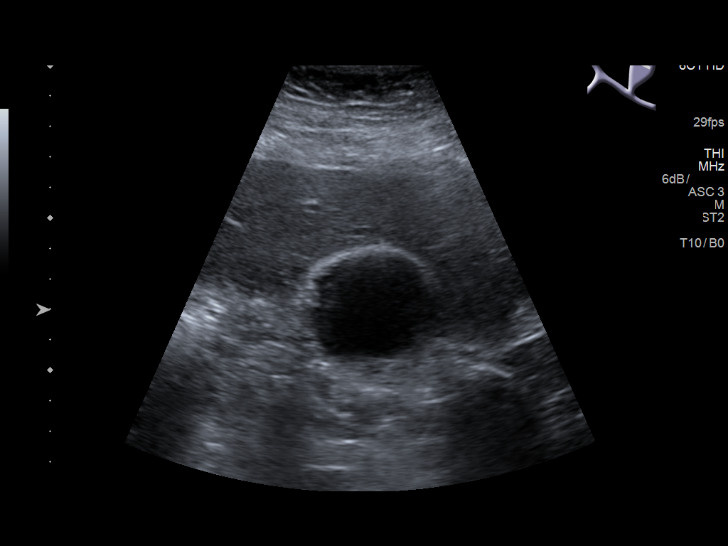
[im 9/33]
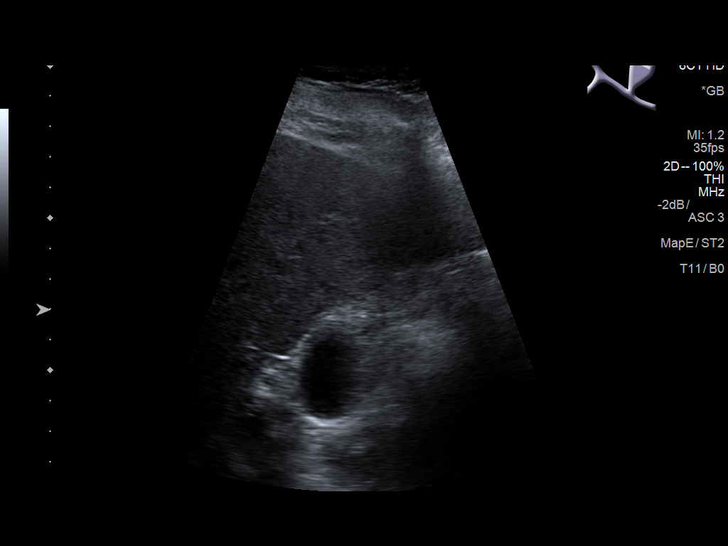
[im 11/33]
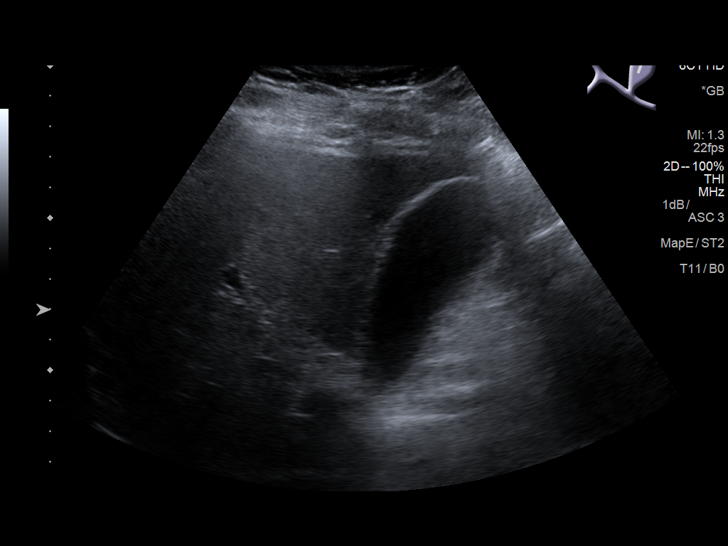
[im 13/33]
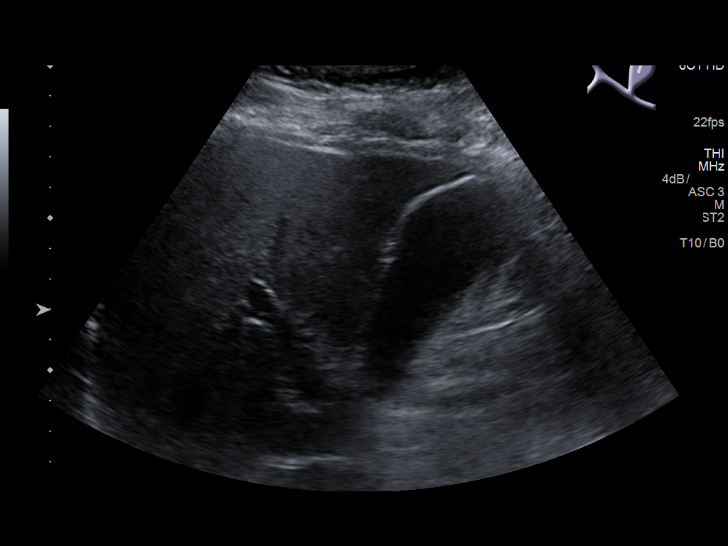
[im 15/33]
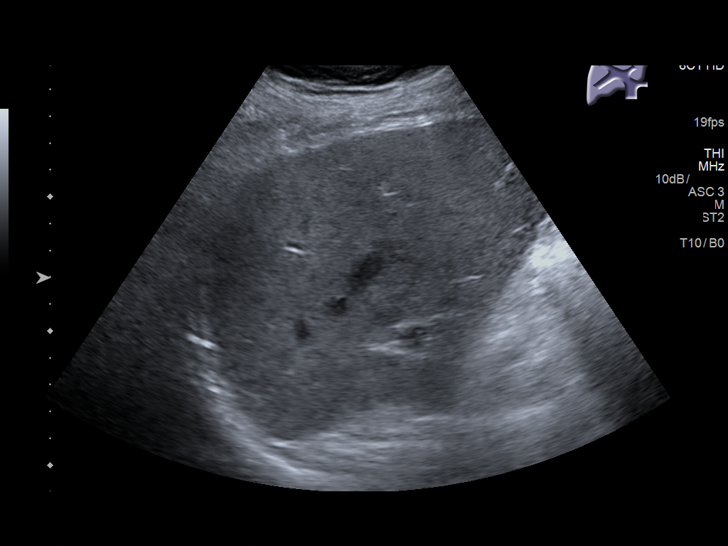
[im 18/33]
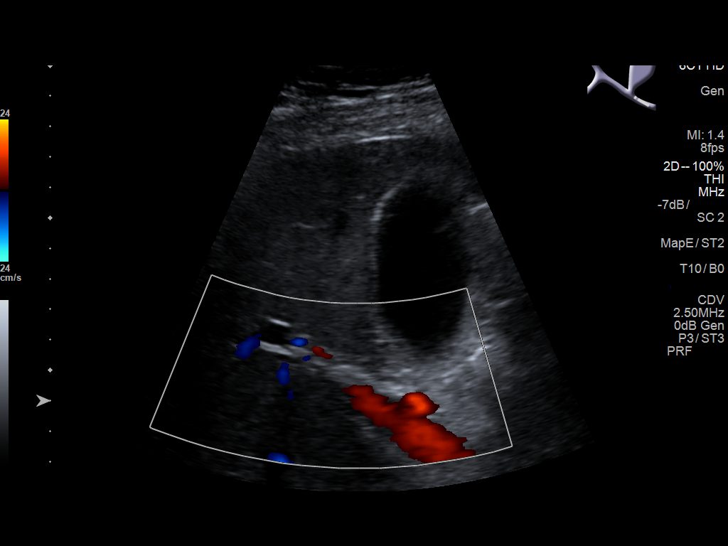
[im 21/33]
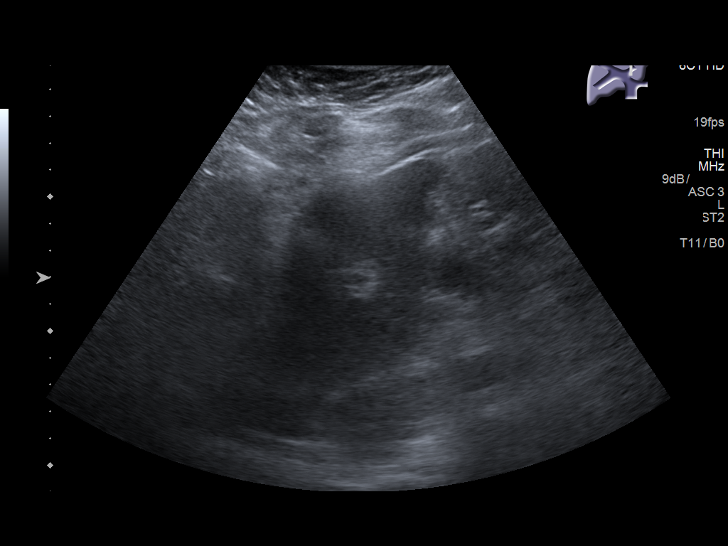
[im 22/33]
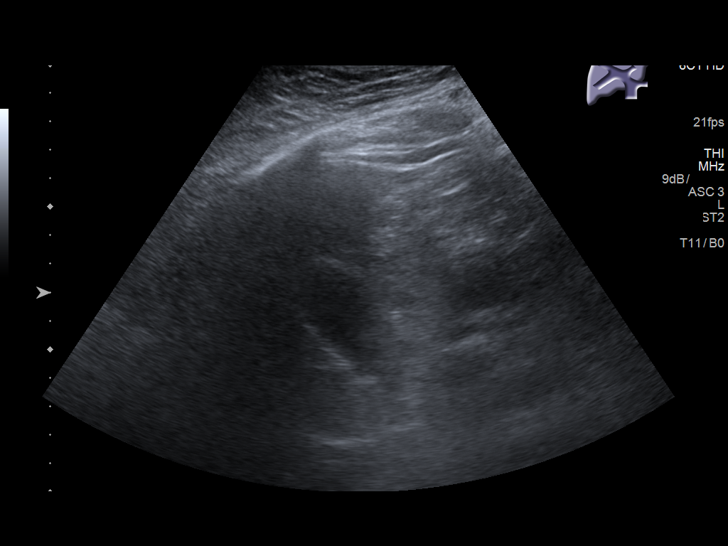
[im 25/33]
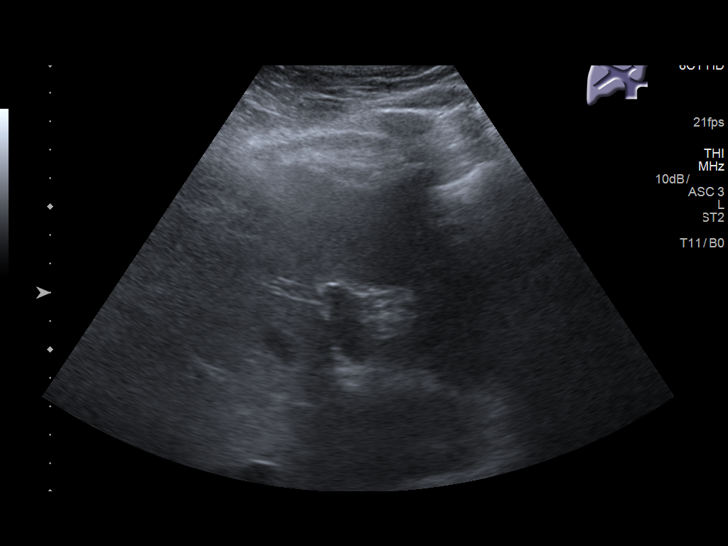
[im 27/33]
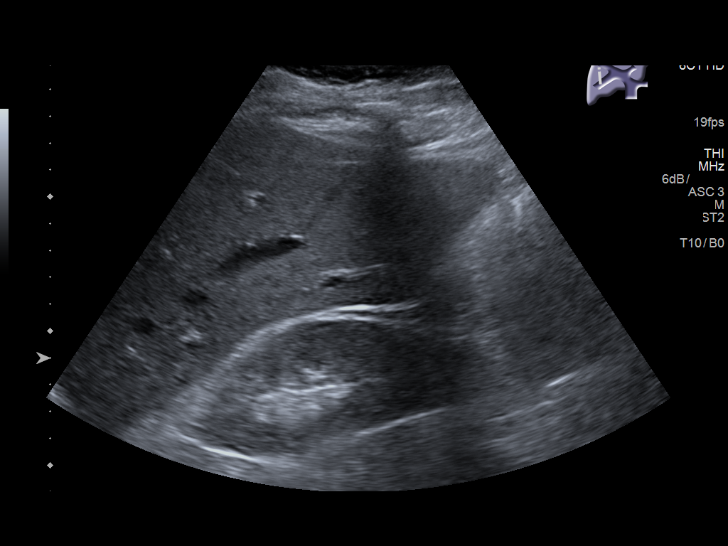
[im 30/33]
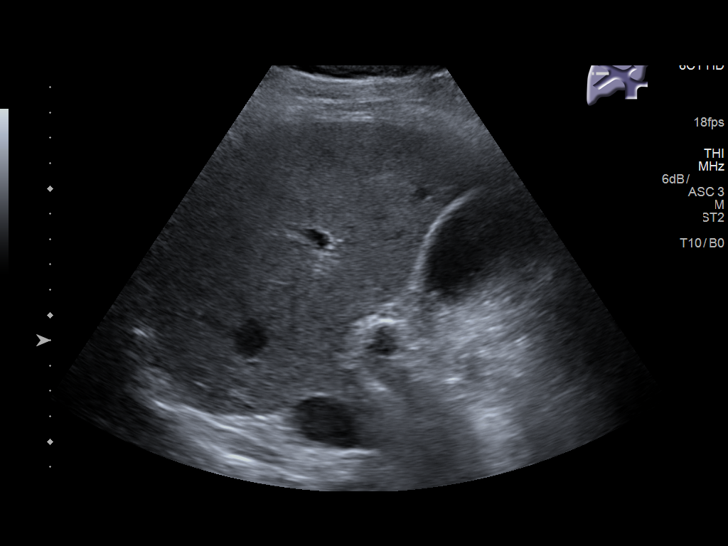
[im 33/33]
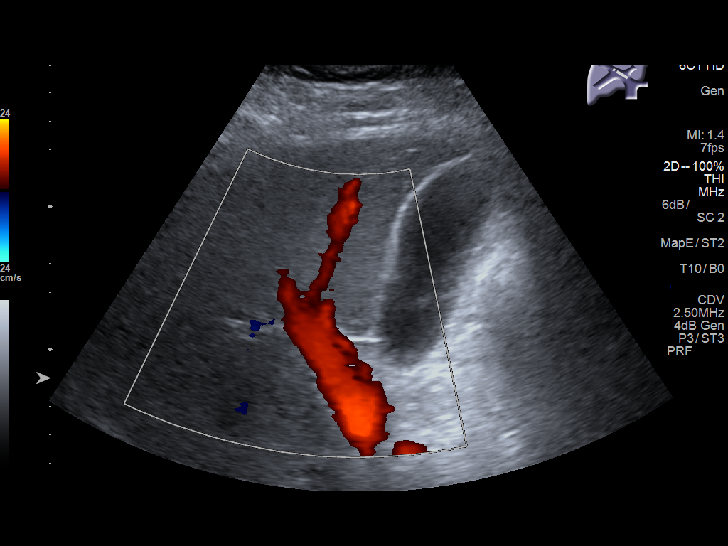

[14 of 25 positions shown; findings below may reference images not displayed]

FINDINGS: Gallbladder:

No gallstones. Gallbladder wall thickness normal. Negative [place].

Common bile duct:

Diameter: 3.3 mm

Liver:

No focal lesion identified. Within normal limits in parenchymal
echogenicity. Portal vein is patent on color Doppler imaging with
normal direction of blood flow towards the liver.
IMPRESSION: No acute or focal abnormality.

## 2021-05-22 ENCOUNTER — Emergency Department
Admission: EM | Admit: 2021-05-22 | Discharge: 2021-05-22 | Disposition: A | Discharge status: Home / Self Care | Payer: Self-pay | Attending: Emergency Medicine | Admitting: Emergency Medicine

## 2021-05-22 ENCOUNTER — Other Ambulatory Visit: Payer: Self-pay

## 2021-05-22 ENCOUNTER — Emergency Department: Payer: Self-pay

## 2021-05-22 ENCOUNTER — Encounter: Payer: Self-pay | Admitting: Emergency Medicine

## 2021-05-22 DIAGNOSIS — F17210 Nicotine dependence, cigarettes, uncomplicated: Secondary | ICD-10-CM | POA: Insufficient documentation

## 2021-05-22 DIAGNOSIS — M25562 Pain in left knee: Secondary | ICD-10-CM | POA: Insufficient documentation

## 2021-05-22 DIAGNOSIS — W01198A Fall on same level from slipping, tripping and stumbling with subsequent striking against other object, initial encounter: Secondary | ICD-10-CM | POA: Insufficient documentation

## 2021-05-22 MED ORDER — IBUPROFEN 200 MG PO TABS: 400.0000 mg | ORAL_TABLET | Freq: Four times a day (QID) | ORAL | 0 refills | Status: AC | PRN

## 2021-05-22 MED ORDER — OXYCODONE-ACETAMINOPHEN 5-325 MG PO TABS
1.0000 | ORAL_TABLET | Freq: Once | ORAL | Status: AC
Start: 2021-05-22 — End: 2021-05-22
  Administered 2021-05-22: 1 via ORAL
  Filled 2021-05-22: qty 1

## 2021-05-22 MED ORDER — IBUPROFEN 400 MG PO TABS
400.0000 mg | ORAL_TABLET | Freq: Once | ORAL | Status: AC
  Administered 2021-05-22: 400 mg via ORAL
  Filled 2021-05-22: qty 1

## 2021-05-22 NOTE — ED Triage Notes (Signed)
Fall Saturday, slipped on water.  C/O swelling and bruising to left knee, unable to bear weight due to pain to leg.

## 2021-05-22 NOTE — Discharge Instructions (Signed)
Your x-ray did not show any broken bones.  You likely bruised the area that you fell onto.  Please take Motrin, elevate the leg and ice.

## 2021-05-22 NOTE — ED Provider Notes (Signed)
[facility]  ____________________________________________   Event Date/Time   First MD Initiated Contact with Patient 05/22/21 1430     (approximate)  I have reviewed the triage vital signs and the nursing notes.   HISTORY  Chief Complaint Knee Injury    HPI Mark Gordon is a 40 y.o. male with no significant past medical history presents with knee pain.  On Saturday, 2 days ago.  Patient slipped and fell directly onto the knee.  He has been able to bear weight but with pain.  Denies any numbness or weakness.  Denies other injuries.         History reviewed. No pertinent past medical history.  There are no problems to display for this patient.   Past Surgical History:  Procedure Laterality Date   TYMPANOSTOMY TUBE PLACEMENT      Prior to Admission medications   Not on File   No Allergies Patient has no known allergies.  No family history on file.  Social History Social History   Tobacco Use   Smoking status: Every Day    Packs/day: 0.25    Types: Cigarettes   Smokeless tobacco: Never  Substance Use Topics   Alcohol use: Yes    Comment: occ. on weekends   Drug use: No    Review of Systems   Review of Systems  Musculoskeletal:  Positive for arthralgias and myalgias.  Neurological:  Negative for weakness and numbness.  All other systems reviewed and are negative.  Physical Exam Updated Vital Signs BP (!) 163/102 (BP Location: Right Arm)   Pulse 94   Temp 98.3 F (36.8 C) (Oral)   Resp 18   Ht 6' (1.829 m)   Wt 102.1 kg   SpO2 95%   BMI 30.53 kg/m   Physical Exam Vitals and nursing note reviewed.  Constitutional:      General: He is not in acute distress.    Appearance: Normal appearance.  HENT:     Head: Normocephalic and atraumatic.  Eyes:     General: No scleral icterus.    Conjunctiva/sclera: Conjunctivae normal.  Pulmonary:     Effort: Pulmonary effort is normal. No respiratory distress.     Breath  sounds: Normal breath sounds. No wheezing.  Musculoskeletal:        General: No deformity or signs of injury.     Cervical back: Normal range of motion.  Skin:    Coloration: Skin is not jaundiced or pale.     Comments: Ecchymosis on the medial surface of the anterior knee, no obvious swelling or deformity, straight leg raise intact, able to range but with pain.  No valgus or varus laxity, 2+ DP pulse  Neurological:     General: No focal deficit present.     Mental Status: He is alert and oriented to person, place, and time. Mental status is at baseline.  Psychiatric:        Mood and Affect: Mood normal.        Behavior: Behavior normal.     LABS (all labs ordered are listed, but only abnormal results are displayed)  Labs Reviewed - No data to display ____________________________________________  EKG   ____________________________________________  RADIOLOGY Ky Barban, personally viewed and evaluated these images (plain radiographs) as part of my medical decision making, as well as reviewing the written report by the radiologist.  ED MD interpretation:  I reviewed the x-ray of the knee which does not show any acute fracture  or malalignment    ____________________________________________   PROCEDURES  Procedure(s) performed (including Critical Care):  Procedures   ____________________________________________   INITIAL IMPRESSION / ASSESSMENT AND PLAN / ED COURSE     40 year old male presents with knee pain after fall directly onto the knee 2 days ago.  Able to bear weight but with significant discomfort.  On exam he does have ecchymosis on the medial tibial surface but no obvious deformity range of motion intact as is straight leg raise.  He is neurovascular intact.  X-ray does not show any acute fracture or malalignment.  Suspect contusion.  Will treat supportively with NSAIDs.  Patient was provided with crutches.       ____________________________________________   FINAL CLINICAL IMPRESSION(S) / ED DIAGNOSES  Final diagnoses:  Acute pain of left knee     ED Discharge Orders     None        Note:  This document was prepared using Dragon voice recognition software and may include unintentional dictation errors.    Georga Hacking, MD 05/22/21 1515

## 2021-05-22 NOTE — ED Notes (Signed)
Pt in xray

## 2023-05-27 ENCOUNTER — Emergency Department: Payer: Self-pay

## 2023-05-27 ENCOUNTER — Emergency Department
Admission: EM | Admit: 2023-05-27 | Discharge: 2023-05-27 | Disposition: A | Discharge status: Home / Self Care | Payer: Self-pay | Attending: Emergency Medicine | Admitting: Emergency Medicine

## 2023-05-27 ENCOUNTER — Other Ambulatory Visit: Payer: Self-pay

## 2023-05-27 ENCOUNTER — Encounter: Payer: Self-pay | Admitting: Intensive Care

## 2023-05-27 DIAGNOSIS — M542 Cervicalgia: Secondary | ICD-10-CM | POA: Insufficient documentation

## 2023-05-27 DIAGNOSIS — S022XXA Fracture of nasal bones, initial encounter for closed fracture: Secondary | ICD-10-CM | POA: Insufficient documentation

## 2023-05-27 DIAGNOSIS — S060XAA Concussion with loss of consciousness status unknown, initial encounter: Secondary | ICD-10-CM | POA: Insufficient documentation

## 2023-05-27 DIAGNOSIS — W010XXA Fall on same level from slipping, tripping and stumbling without subsequent striking against object, initial encounter: Secondary | ICD-10-CM | POA: Insufficient documentation

## 2023-05-27 MED ORDER — ONDANSETRON 4 MG PO TBDP: 4.0000 mg | ORAL_TABLET | Freq: Three times a day (TID) | ORAL | 0 refills | Status: DC | PRN

## 2023-05-27 MED ORDER — OXYCODONE-ACETAMINOPHEN 5-325 MG PO TABS
1.0000 | ORAL_TABLET | ORAL | 0 refills | Status: DC | PRN
Start: 2023-05-27 — End: 2023-05-27

## 2023-05-27 MED ORDER — OXYCODONE-ACETAMINOPHEN 5-325 MG PO TABS
1.0000 | ORAL_TABLET | Freq: Once | ORAL | Status: AC
  Administered 2023-05-27: 1 via ORAL
  Filled 2023-05-27: qty 1

## 2023-05-27 MED ORDER — OXYCODONE-ACETAMINOPHEN 5-325 MG PO TABS
1.0000 | ORAL_TABLET | ORAL | 0 refills | Status: AC | PRN
Start: 2023-05-27 — End: 2023-06-01

## 2023-05-27 NOTE — ED Provider Notes (Signed)
[facility] Provider Note    Event Date/Time   First MD Initiated Contact with Patient 05/27/23 2011     (approximate)   History   Fall and Facial Injury   HPI  Mark Gordon is a 42 y.o. male with no significant past medical history who presents with a facial injury acute onset yesterday when he slipped on a wet floor, fell forward, and hit his face on the ground.  He thinks he may have very briefly lost consciousness.  Since that time he has had headache, some lightheadedness, and feels foggy.  He reports pain to the nose and face.  He also reports mild neck pain.  I reviewed the past medical records.  The patient's most recent outpatient encounter was at [facility] June for gastroenteritis.   Physical Exam   Triage Vital Signs: ED Triage Vitals  Encounter Vitals Group     BP 05/27/23 1752 (!) 180/114     Systolic BP Percentile --      Diastolic BP Percentile --      Pulse Rate 05/27/23 1752 83     Resp 05/27/23 1752 20     Temp 05/27/23 1752 98 F (36.7 C)     Temp Source 05/27/23 1752 Oral     SpO2 05/27/23 1752 95 %     Weight 05/27/23 1753 275 lb (124.7 kg)     Height 05/27/23 1753 6\' 2"  (1.88 m)     Head Circumference --      Peak Flow --      Pain Score 05/27/23 1753 8     Pain Loc --      Pain Education --      Exclude from Growth Chart --     Most recent vital signs: Vitals:   05/27/23 1752  BP: (!) 180/114  Pulse: 83  Resp: 20  Temp: 98 F (36.7 C)  SpO2: 95%     General: Awake, no distress.  CV:  Good peripheral perfusion.  Resp:  Normal effort.  Abd:  No distention.  Other:  EOMI.  PERRLA.  No photophobia.  No facial droop.  Normal speech.  Motor intact in all extremities.  Normal gait.  Tenderness to the nasal bridge bilaterally with a 2 cm superficial abrasion.  No nasal septal hematoma.  No midline cervical spinal tenderness.   ED Results / Procedures / Treatments   Labs (all labs ordered are listed, but  only abnormal results are displayed) Labs Reviewed - No data to display   EKG     RADIOLOGY  CT head/cervical spine/maxillofacial: I independently viewed and interpreted the images; there is no ICH.  Radiology report indicates the following:  IMPRESSION:  1. No acute intracranial pathology.  2. Minimally displaced nasal bone fractures.  3. Soft tissue contusion of the forehead and nose.  4. No fracture or static subluxation of the cervical spine.  5. 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    PROCEDURES:  Critical Care performed: No  Procedures   MEDICATIONS ORDERED IN ED: Medications  oxyCODONE-acetaminophen (PERCOCET/ROXICET) 5-325 MG per tablet 1 tablet (1 tablet Oral Given 05/27/23 2103)     IMPRESSION / MDM / ASSESSMENT AND PLAN / ED COURSE  I reviewed the triage vital signs and the nursing notes.  Differential diagnosis includes, but is not limited to, minor head injury, concussion, ICH, facial contusion, fracture.  Patient's presentation is most consistent with acute complicated illness /  injury requiring diagnostic workup.  CT is negative for ICH.  There is no cervical spine fracture.  Head injury is consistent with a concussion.  Maxillofacial CT reveals nasal bone fractures but no other acute findings.  The patient is stable for discharge home.  I counseled him on the results of workup.  I have prescribed him pain and nausea medication and given him referral to ENT.  I counseled him on concussion precautions and overall return precautions and he expressed understanding.   FINAL CLINICAL IMPRESSION(S) / ED DIAGNOSES   Final diagnoses:  Closed fracture of nasal bone, initial encounter  Concussion with unknown loss of consciousness status, initial encounter     Rx / DC Orders   ED Discharge Orders          Ordered    oxyCODONE-acetaminophen (PERCOCET) 5-325 MG tablet  Every 4 hours PRN,   Status:   Discontinued        05/27/23 2107    ondansetron (ZOFRAN-ODT) 4 MG disintegrating tablet  Every 8 hours PRN,   Status:  Discontinued        05/27/23 2107    ondansetron (ZOFRAN-ODT) 4 MG disintegrating tablet  Every 8 hours PRN        05/27/23 2110    oxyCODONE-acetaminophen (PERCOCET) 5-325 MG tablet  Every 4 hours PRN        05/27/23 2110             Note:  This document was prepared using Dragon voice recognition software and may include unintentional dictation errors.    Dionne Bucy, MD 05/27/23 2350

## 2023-05-27 NOTE — Discharge Instructions (Addendum)
You can take the Percocet as needed for pain over the next couple of days, but should then transition to over-the-counter Tylenol or ibuprofen.  You may take the Zofran as needed for nausea.  Avoid blowing your nose.  Follow-up with the ENT in 1 to 2 weeks.  Return to the ER for new, worsening, or persistent severe headache, dizziness, vomiting, or any other new or worsening symptoms that concern you.

## 2023-05-27 NOTE — ED Triage Notes (Signed)
Patient reports yesterday afternoon having mechanical fall and face planting on floor. Presents with bruising and swelling noted to face/nose. Denies LOC. C/o facial pain and neck pain

## 2023-06-20 IMAGING — CR DG KNEE COMPLETE 4+V*L*
1 series · 4 of 4 positions shown · non-contrast
Comparison: None.

CLINICAL DATA: Fall [REDACTED], slipped on water. C/O swelling and
bruising to left knee, unable to bear weight due to pain to leg

EXAM:
LEFT KNEE - COMPLETE 4 VIEW

[Series 1: dg knee complete 4 views left · 0.14mm/px · 4 of 4 slices shown]
[im 1/4]
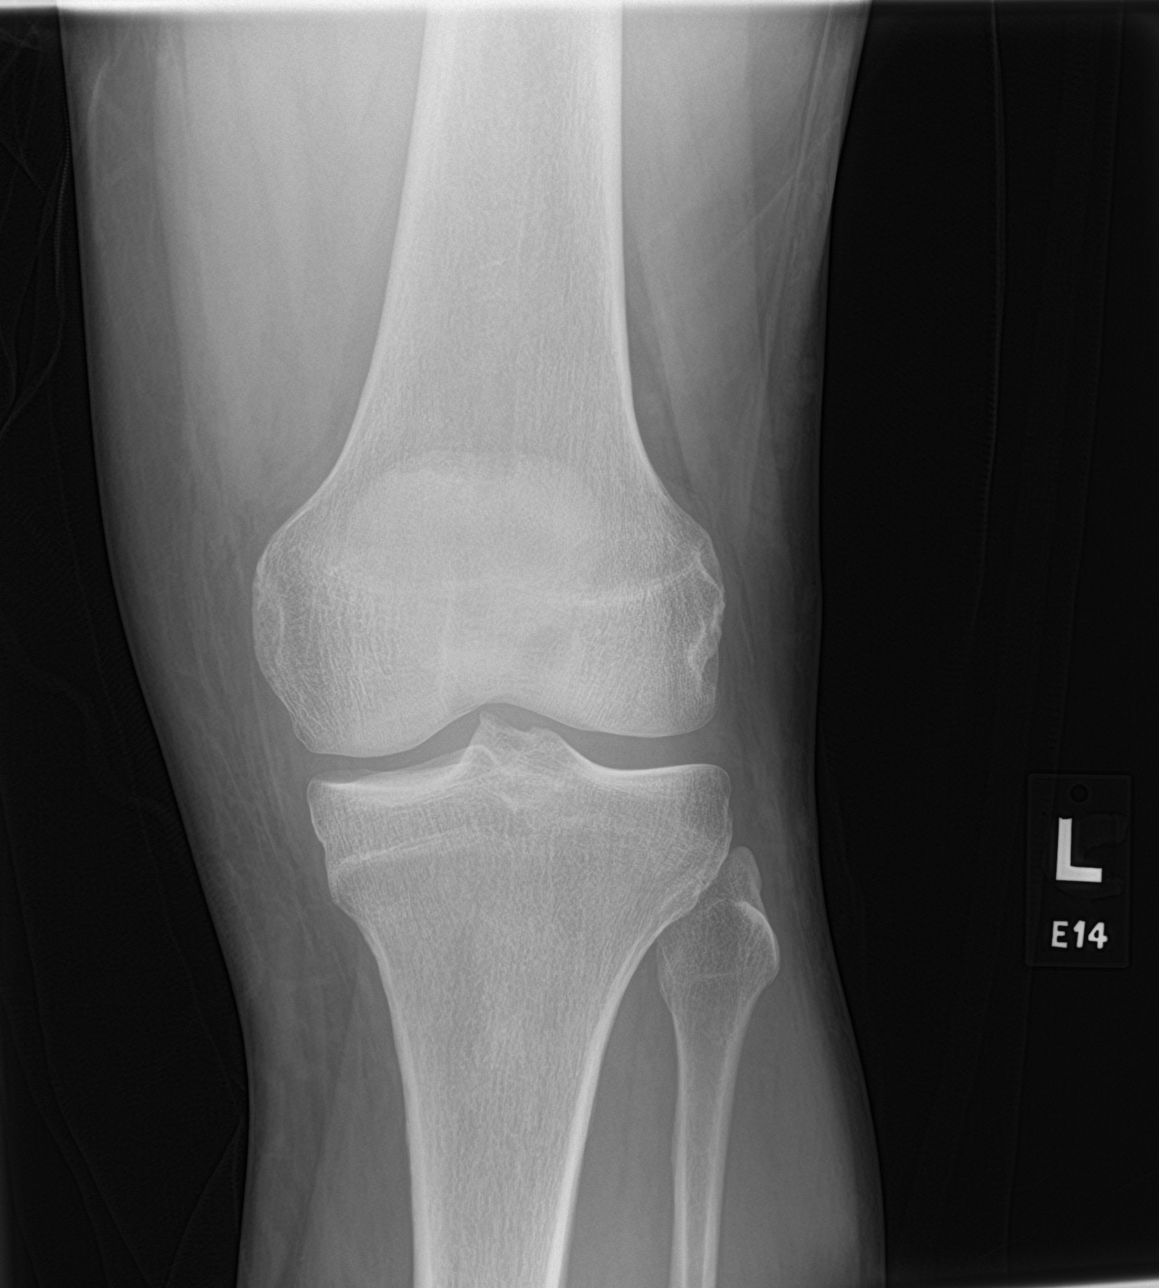
[im 2/4]
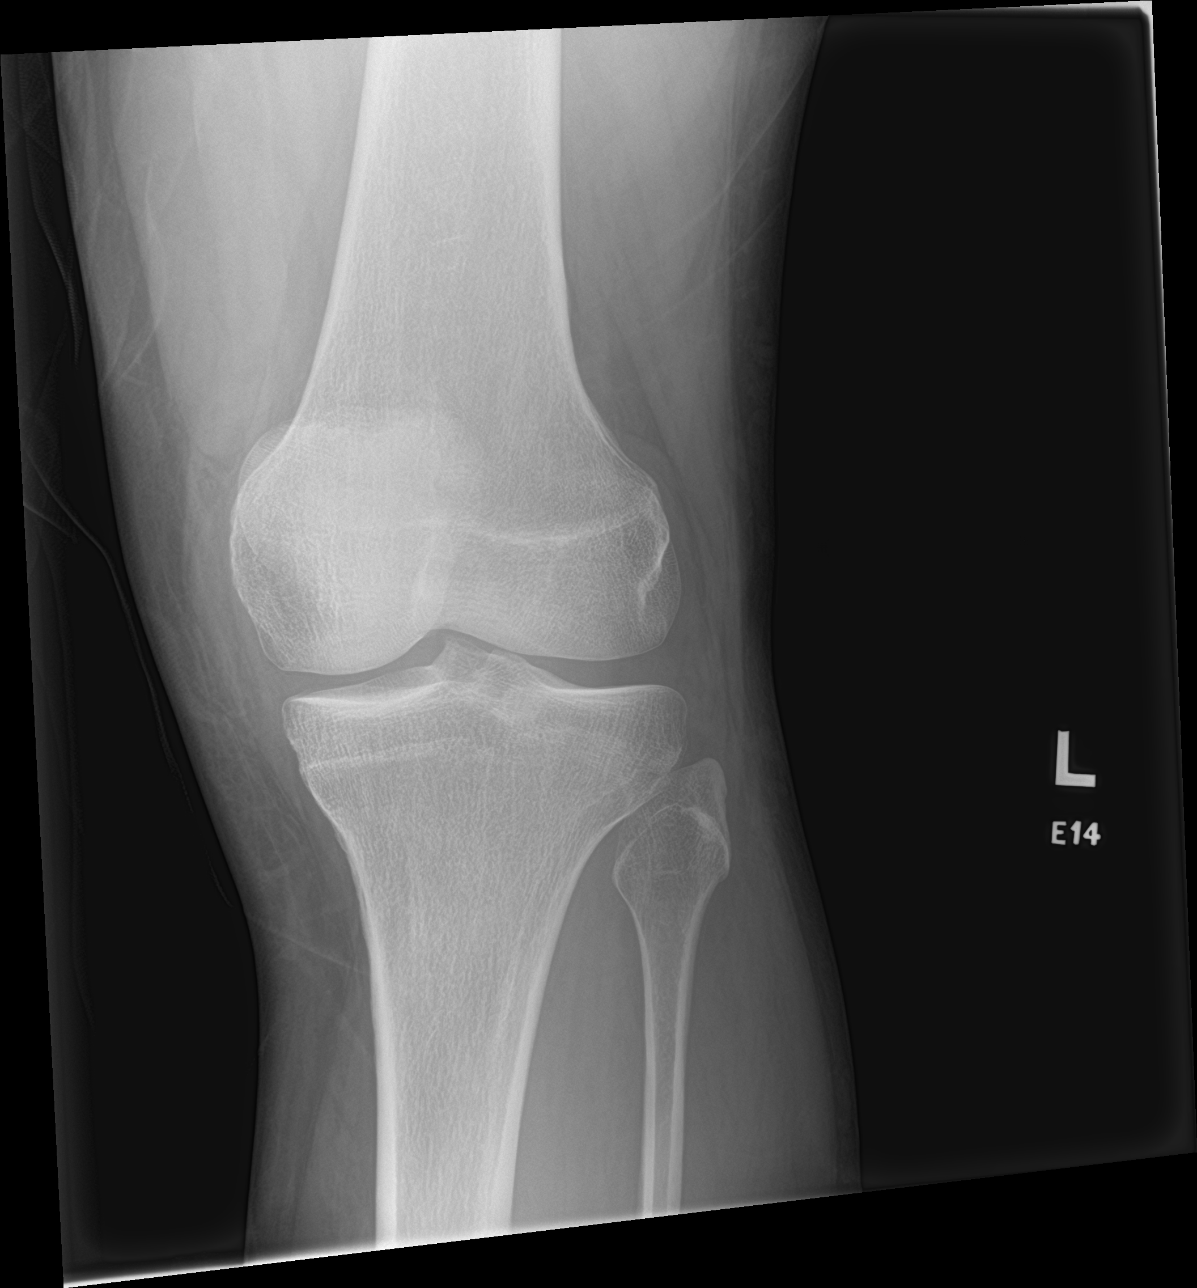
[im 3/4]
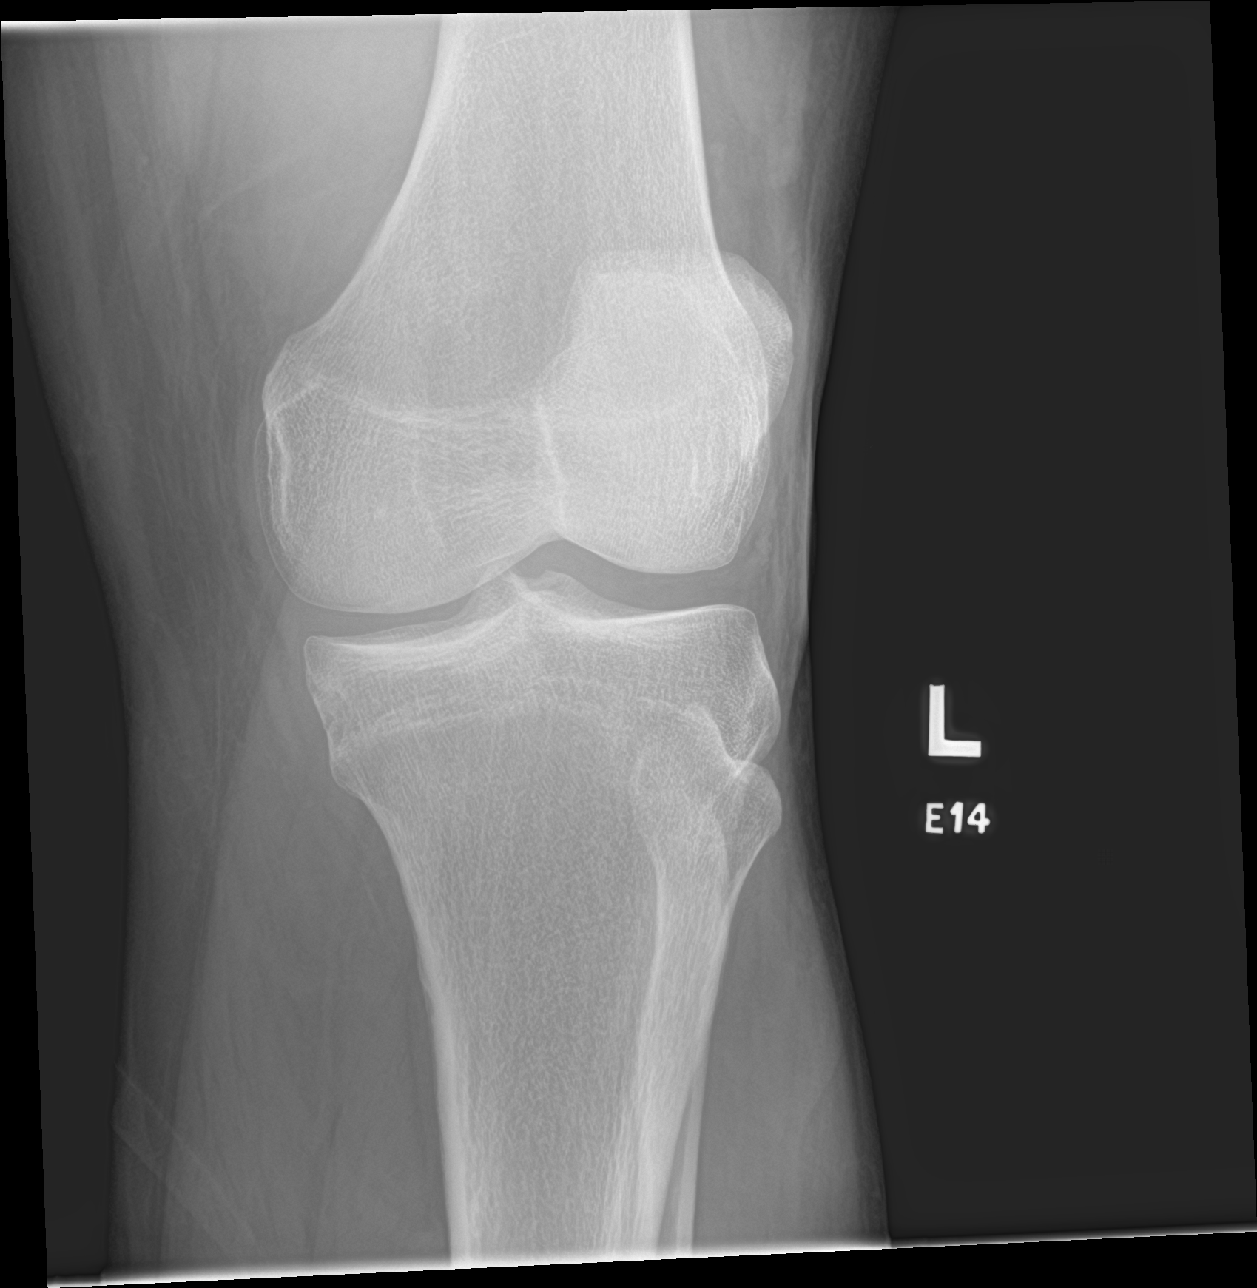
[im 4/4]
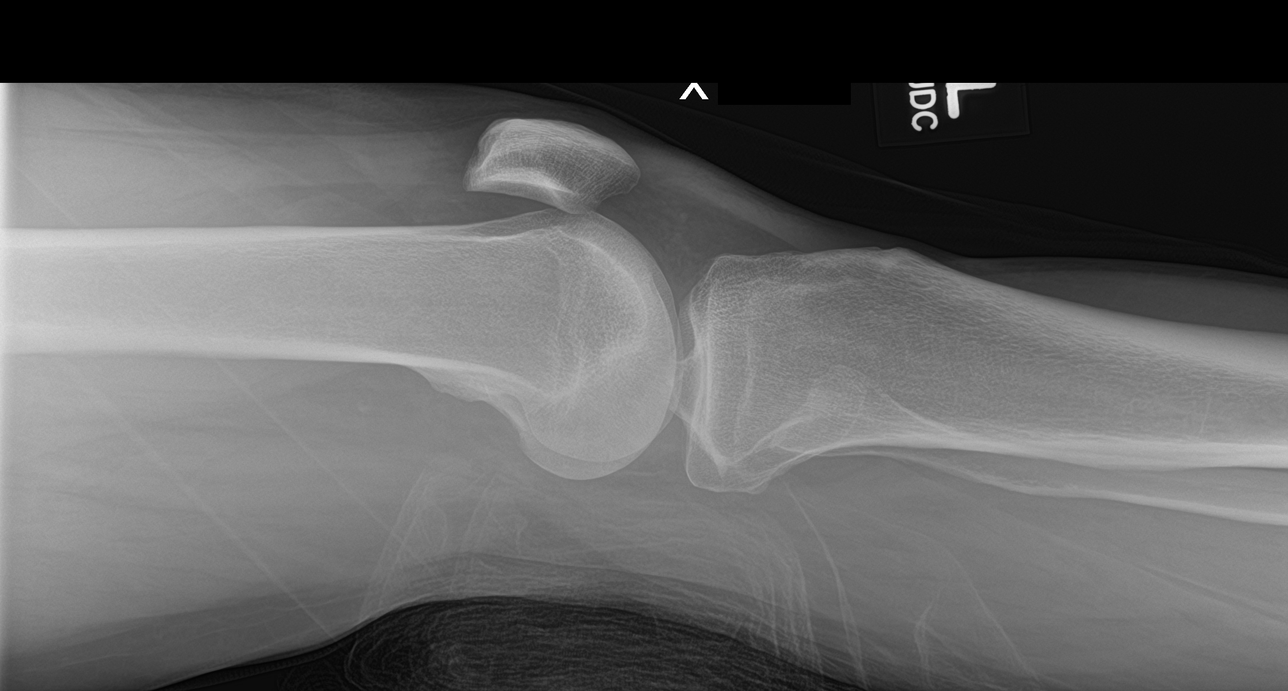

[4 of 4 positions shown; findings below may reference images not displayed]

FINDINGS: No evidence of fracture, dislocation, or joint effusion. No evidence
of arthropathy or other focal bone abnormality. Soft tissues are
unremarkable.
IMPRESSION: Negative.

## 2024-01-16 ENCOUNTER — Emergency Department: Payer: Self-pay

## 2024-01-16 ENCOUNTER — Inpatient Hospital Stay
Admission: EM | Admit: 2024-01-16 | Discharge: 2024-01-23 | DRG: 082 | Disposition: A | Discharge status: Home / Self Care | Payer: MEDICAID | Attending: Internal Medicine | Admitting: Internal Medicine

## 2024-01-16 ENCOUNTER — Inpatient Hospital Stay: Payer: Self-pay

## 2024-01-16 ENCOUNTER — Encounter: Payer: Self-pay | Admitting: Emergency Medicine

## 2024-01-16 DIAGNOSIS — R296 Repeated falls: Secondary | ICD-10-CM | POA: Diagnosis present

## 2024-01-16 DIAGNOSIS — S02119A Unspecified fracture of occiput, initial encounter for closed fracture: Secondary | ICD-10-CM | POA: Diagnosis present

## 2024-01-16 DIAGNOSIS — W010XXA Fall on same level from slipping, tripping and stumbling without subsequent striking against object, initial encounter: Secondary | ICD-10-CM | POA: Diagnosis present

## 2024-01-16 DIAGNOSIS — I609 Nontraumatic subarachnoid hemorrhage, unspecified: Secondary | ICD-10-CM

## 2024-01-16 DIAGNOSIS — F05 Delirium due to known physiological condition: Secondary | ICD-10-CM | POA: Diagnosis present

## 2024-01-16 DIAGNOSIS — F10929 Alcohol use, unspecified with intoxication, unspecified: Principal | ICD-10-CM

## 2024-01-16 DIAGNOSIS — W19XXXA Unspecified fall, initial encounter: Secondary | ICD-10-CM

## 2024-01-16 DIAGNOSIS — Z9181 History of falling: Secondary | ICD-10-CM

## 2024-01-16 DIAGNOSIS — F419 Anxiety disorder, unspecified: Secondary | ICD-10-CM | POA: Diagnosis present

## 2024-01-16 DIAGNOSIS — Z6838 Body mass index (BMI) 38.0-38.9, adult: Secondary | ICD-10-CM | POA: Diagnosis not present

## 2024-01-16 DIAGNOSIS — F10239 Alcohol dependence with withdrawal, unspecified: Secondary | ICD-10-CM | POA: Diagnosis present

## 2024-01-16 DIAGNOSIS — E876 Hypokalemia: Secondary | ICD-10-CM

## 2024-01-16 DIAGNOSIS — E66812 Obesity, class 2: Secondary | ICD-10-CM

## 2024-01-16 DIAGNOSIS — S0291XA Unspecified fracture of skull, initial encounter for closed fracture: Secondary | ICD-10-CM | POA: Diagnosis not present

## 2024-01-16 DIAGNOSIS — G4700 Insomnia, unspecified: Secondary | ICD-10-CM | POA: Diagnosis present

## 2024-01-16 DIAGNOSIS — F17210 Nicotine dependence, cigarettes, uncomplicated: Secondary | ICD-10-CM

## 2024-01-16 DIAGNOSIS — J449 Chronic obstructive pulmonary disease, unspecified: Secondary | ICD-10-CM | POA: Diagnosis present

## 2024-01-16 DIAGNOSIS — Z79899 Other long term (current) drug therapy: Secondary | ICD-10-CM

## 2024-01-16 DIAGNOSIS — F17290 Nicotine dependence, other tobacco product, uncomplicated: Secondary | ICD-10-CM | POA: Diagnosis present

## 2024-01-16 DIAGNOSIS — I2510 Atherosclerotic heart disease of native coronary artery without angina pectoris: Secondary | ICD-10-CM | POA: Diagnosis present

## 2024-01-16 DIAGNOSIS — S065XAA Traumatic subdural hemorrhage with loss of consciousness status unknown, initial encounter: Secondary | ICD-10-CM | POA: Diagnosis present

## 2024-01-16 DIAGNOSIS — S066XAA Traumatic subarachnoid hemorrhage with loss of consciousness status unknown, initial encounter: Secondary | ICD-10-CM | POA: Diagnosis present

## 2024-01-16 DIAGNOSIS — R569 Unspecified convulsions: Secondary | ICD-10-CM | POA: Diagnosis present

## 2024-01-16 DIAGNOSIS — D696 Thrombocytopenia, unspecified: Secondary | ICD-10-CM | POA: Diagnosis present

## 2024-01-16 DIAGNOSIS — S065X0A Traumatic subdural hemorrhage without loss of consciousness, initial encounter: Secondary | ICD-10-CM | POA: Diagnosis not present

## 2024-01-16 DIAGNOSIS — F10221 Alcohol dependence with intoxication delirium: Secondary | ICD-10-CM | POA: Diagnosis present

## 2024-01-16 DIAGNOSIS — Z743 Need for continuous supervision: Secondary | ICD-10-CM | POA: Diagnosis not present

## 2024-01-16 DIAGNOSIS — E871 Hypo-osmolality and hyponatremia: Secondary | ICD-10-CM | POA: Diagnosis present

## 2024-01-16 DIAGNOSIS — R4781 Slurred speech: Secondary | ICD-10-CM | POA: Diagnosis not present

## 2024-01-16 DIAGNOSIS — G9341 Metabolic encephalopathy: Secondary | ICD-10-CM | POA: Diagnosis present

## 2024-01-16 DIAGNOSIS — S0633AA Contusion and laceration of cerebrum, unspecified, with loss of consciousness status unknown, initial encounter: Secondary | ICD-10-CM

## 2024-01-16 DIAGNOSIS — S066X0A Traumatic subarachnoid hemorrhage without loss of consciousness, initial encounter: Secondary | ICD-10-CM | POA: Diagnosis not present

## 2024-01-16 LAB — COMPREHENSIVE METABOLIC PANEL WITH GFR
ALT: 34 U/L (ref 0–44)
AST: 35 U/L (ref 15–41)
Albumin: 3.7 g/dL (ref 3.5–5.0)
Alkaline Phosphatase: 86 U/L (ref 38–126)
Anion gap: 14 (ref 5–15)
BUN: 7 mg/dL (ref 6–20)
CO2: 17 mmol/L — ABNORMAL LOW (ref 22–32)
Calcium: 8.3 mg/dL — ABNORMAL LOW (ref 8.9–10.3)
Chloride: 93 mmol/L — ABNORMAL LOW (ref 98–111)
Creatinine, Ser: 0.96 mg/dL (ref 0.61–1.24)
GFR, Estimated: >60 mL/min (ref >60)
Glucose, Bld: 115 mg/dL — ABNORMAL HIGH (ref 70–99)
Potassium: 3.2 mmol/L — ABNORMAL LOW (ref 3.5–5.1)
Sodium: 124 mmol/L — ABNORMAL LOW (ref 135–145)
Total Bilirubin: 1.1 mg/dL (ref 0.0–1.2)
Total Protein: 6.8 g/dL (ref 6.5–8.1)

## 2024-01-16 LAB — CBC WITH DIFFERENTIAL/PLATELET
Abs Immature Granulocytes: 0.02 10*3/uL (ref 0.00–0.07)
Basophils Absolute: 0 10*3/uL (ref 0.0–0.1)
Basophils Relative: 1 %
Eosinophils Absolute: 0.1 10*3/uL (ref 0.0–0.5)
Eosinophils Relative: 1 %
HCT: 45.8 % (ref 39.0–52.0)
Hemoglobin: 16.4 g/dL (ref 13.0–17.0)
Immature Granulocytes: 0 %
Lymphocytes Relative: 19 %
Lymphs Abs: 0.9 10*3/uL (ref 0.7–4.0)
MCH: 35 pg — ABNORMAL HIGH (ref 26.0–34.0)
MCHC: 35.8 g/dL (ref 30.0–36.0)
MCV: 97.7 fL (ref 80.0–100.0)
Monocytes Absolute: 0.3 10*3/uL (ref 0.1–1.0)
Monocytes Relative: 6 %
Neutro Abs: 3.5 10*3/uL (ref 1.7–7.7)
Neutrophils Relative %: 73 %
Platelets: 121 10*3/uL — ABNORMAL LOW (ref 150–400)
RBC: 4.69 MIL/uL (ref 4.22–5.81)
RDW: 13.2 % (ref 11.5–15.5)
WBC: 4.8 10*3/uL (ref 4.0–10.5)
nRBC: 0 % (ref 0.0–0.2)

## 2024-01-16 LAB — URINALYSIS, ROUTINE W REFLEX MICROSCOPIC
Bilirubin Urine: NEGATIVE
Glucose, UA: NEGATIVE mg/dL
Hgb urine dipstick: NEGATIVE
Ketones, ur: NEGATIVE mg/dL
Leukocytes,Ua: NEGATIVE
Nitrite: NEGATIVE
Protein, ur: NEGATIVE mg/dL
Specific Gravity, Urine: 1.009 (ref 1.005–1.030)
pH: 5 (ref 5.0–8.0)

## 2024-01-16 LAB — URINE DRUG SCREEN, QUALITATIVE (ARMC ONLY)
Amphetamines, Ur Screen: NOT DETECTED
Barbiturates, Ur Screen: NOT DETECTED
Benzodiazepine, Ur Scrn: NOT DETECTED
Cannabinoid 50 Ng, Ur ~~LOC~~: POSITIVE — AB
Cocaine Metabolite,Ur ~~LOC~~: NOT DETECTED
MDMA (Ecstasy)Ur Screen: NOT DETECTED
Methadone Scn, Ur: NOT DETECTED
Opiate, Ur Screen: NOT DETECTED
Phencyclidine (PCP) Ur S: NOT DETECTED
Tricyclic, Ur Screen: NOT DETECTED

## 2024-01-16 LAB — PROTIME-INR
INR: 1.1 (ref 0.8–1.2)
Prothrombin Time: 14.3 s (ref 11.4–15.2)

## 2024-01-16 LAB — CBG MONITORING, ED: Glucose-Capillary: 127 mg/dL — ABNORMAL HIGH (ref 70–99)

## 2024-01-16 LAB — CK: Total CK: 178 U/L (ref 49–397)

## 2024-01-16 LAB — AMMONIA: Ammonia: 20 umol/L (ref 9–35)

## 2024-01-16 LAB — ETHANOL: Alcohol, Ethyl (B): 286 mg/dL — ABNORMAL HIGH (ref <15)

## 2024-01-16 LAB — MAGNESIUM: Magnesium: 2.2 mg/dL (ref 1.7–2.4)

## 2024-01-16 LAB — SODIUM
Sodium: 132 mmol/L — ABNORMAL LOW (ref 135–145)
Sodium: 131 mmol/L — ABNORMAL LOW (ref 135–145)

## 2024-01-16 LAB — ACETAMINOPHEN LEVEL: Acetaminophen (Tylenol), Serum: <10 ug/mL — ABNORMAL LOW (ref 10–30)

## 2024-01-16 LAB — SALICYLATE LEVEL: Salicylate Lvl: <7 mg/dL — ABNORMAL LOW (ref 7.0–30.0)

## 2024-01-16 MED ORDER — POTASSIUM CHLORIDE 10 MEQ/100ML IV SOLN
10.0000 meq | INTRAVENOUS | Status: AC
  Administered 2024-01-16 (×2): 10 meq via INTRAVENOUS
  Filled 2024-01-16 (×2): qty 100

## 2024-01-16 MED ORDER — LORAZEPAM 2 MG/ML IJ SOLN
1.0000 mg | Freq: Once | INTRAMUSCULAR | Status: AC
  Administered 2024-01-16: 1 mg via INTRAVENOUS
  Filled 2024-01-16: qty 1

## 2024-01-16 MED ORDER — LORAZEPAM 1 MG PO TABS: 1.0000 mg | ORAL_TABLET | ORAL | Status: DC | PRN

## 2024-01-16 MED ORDER — NICOTINE 21 MG/24HR TD PT24
21.0000 mg | MEDICATED_PATCH | Freq: Once | TRANSDERMAL | Status: AC
  Administered 2024-01-16: 21 mg via TRANSDERMAL
  Filled 2024-01-16: qty 1

## 2024-01-16 MED ORDER — THIAMINE HCL 100 MG/ML IJ SOLN
100.0000 mg | Freq: Every day | INTRAMUSCULAR | Status: DC
Start: 2024-01-16 — End: 2024-01-18
  Filled 2024-01-16: qty 2

## 2024-01-16 MED ORDER — SODIUM CHLORIDE 0.9 % IV BOLUS (SEPSIS)
1000.0000 mL | Freq: Once | INTRAVENOUS | Status: AC
  Administered 2024-01-16: 1000 mL via INTRAVENOUS

## 2024-01-16 MED ORDER — THIAMINE MONONITRATE 100 MG PO TABS
100.0000 mg | ORAL_TABLET | Freq: Every day | ORAL | Status: DC
Start: 2024-01-16 — End: 2024-01-18
  Administered 2024-01-16 – 2024-01-17 (×2): 100 mg via ORAL
  Filled 2024-01-16 (×2): qty 1

## 2024-01-16 MED ORDER — THIAMINE HCL 100 MG/ML IJ SOLN
100.0000 mg | Freq: Once | INTRAMUSCULAR | Status: AC
  Administered 2024-01-16: 100 mg via INTRAVENOUS
  Filled 2024-01-16: qty 2

## 2024-01-16 MED ORDER — FOLIC ACID 1 MG PO TABS
1.0000 mg | ORAL_TABLET | Freq: Every day | ORAL | Status: DC
  Administered 2024-01-16 – 2024-01-23 (×7): 1 mg via ORAL
  Filled 2024-01-16 (×8): qty 1

## 2024-01-16 MED ORDER — LORAZEPAM 2 MG/ML IJ SOLN
1.0000 mg | INTRAMUSCULAR | Status: DC | PRN
  Administered 2024-01-16: 3 mg via INTRAVENOUS
  Administered 2024-01-17 (×2): 2 mg via INTRAVENOUS
  Administered 2024-01-17: 1 mg via INTRAVENOUS
  Administered 2024-01-18 (×2): 2 mg via INTRAVENOUS
  Administered 2024-01-19: 1 mg via INTRAVENOUS
  Filled 2024-01-16 (×3): qty 1
  Filled 2024-01-16: qty 2
  Filled 2024-01-16 (×3): qty 1

## 2024-01-16 MED ORDER — LABETALOL HCL 5 MG/ML IV SOLN
20.0000 mg | INTRAVENOUS | Status: DC | PRN
  Administered 2024-01-18 – 2024-01-19 (×5): 20 mg via INTRAVENOUS
  Filled 2024-01-16 (×6): qty 4

## 2024-01-16 MED ORDER — ADULT MULTIVITAMIN W/MINERALS CH
1.0000 | ORAL_TABLET | Freq: Every day | ORAL | Status: DC
  Administered 2024-01-16 – 2024-01-23 (×7): 1 via ORAL
  Filled 2024-01-16 (×8): qty 1

## 2024-01-16 MED ORDER — ONDANSETRON HCL 4 MG/2ML IJ SOLN
4.0000 mg | Freq: Four times a day (QID) | INTRAMUSCULAR | Status: DC | PRN
  Administered 2024-01-16: 4 mg via INTRAVENOUS
  Filled 2024-01-16: qty 2

## 2024-01-16 MED ORDER — IOHEXOL 350 MG/ML SOLN
75.0000 mL | Freq: Once | INTRAVENOUS | Status: AC | PRN
  Administered 2024-01-16: 75 mL via INTRAVENOUS

## 2024-01-16 MED ORDER — LEVETIRACETAM 500 MG PO TABS
500.0000 mg | ORAL_TABLET | Freq: Once | ORAL | Status: AC
  Administered 2024-01-16: 500 mg via ORAL
  Filled 2024-01-16: qty 1

## 2024-01-16 MED ORDER — SODIUM CHLORIDE 0.9 % IV SOLN: INTRAVENOUS | Status: DC

## 2024-01-16 MED ORDER — ACETAMINOPHEN 325 MG PO TABS
650.0000 mg | ORAL_TABLET | Freq: Four times a day (QID) | ORAL | Status: DC | PRN
  Administered 2024-01-17 – 2024-01-22 (×8): 650 mg via ORAL
  Filled 2024-01-16 (×9): qty 2

## 2024-01-16 MED ORDER — LEVETIRACETAM 500 MG PO TABS
500.0000 mg | ORAL_TABLET | Freq: Two times a day (BID) | ORAL | Status: AC
  Administered 2024-01-16 – 2024-01-23 (×14): 500 mg via ORAL
  Filled 2024-01-16 (×15): qty 1

## 2024-01-16 MED ORDER — ONDANSETRON HCL 4 MG/2ML IJ SOLN
4.0000 mg | Freq: Once | INTRAMUSCULAR | Status: AC
  Administered 2024-01-16: 4 mg via INTRAVENOUS
  Filled 2024-01-16: qty 2

## 2024-01-16 MED ORDER — POTASSIUM CHLORIDE CRYS ER 20 MEQ PO TBCR
40.0000 meq | EXTENDED_RELEASE_TABLET | Freq: Once | ORAL | Status: AC
  Administered 2024-01-16: 40 meq via ORAL
  Filled 2024-01-16: qty 2

## 2024-01-16 NOTE — Plan of Care (Signed)

## 2024-01-16 NOTE — ED Notes (Addendum)
 Keylor Rands (mother) called and updated on patient status per pt request

## 2024-01-16 NOTE — Progress Notes (Signed)
 CTA head and neck result reviewed, no traumatic damage.

## 2024-01-16 NOTE — Discharge Instructions (Addendum)

## 2024-01-16 NOTE — Consult Note (Signed)
 [facility] Psychiatric Consult Initial  Patient Name: .Mark Gordon  MRN: [id number]  DOB: October 21, 1980  Consult Order details:  Orders (From admission, onward)     Start     Ordered   01/16/24 1126  IP CONSULT TO PSYCHIATRY       Ordering Provider: Frank [place], MD  Provider:  (Not yet assigned)  Question Answer Comment  Location [facility]   Reason for Consult? Delirium      01/16/24 1125             Mode of Visit: In person    Psychiatry Consult Evaluation  Service Date: Jan 16, 2024 LOS:  LOS: 0 days  Chief Complaint "I tripped and fell" Mark Gordon is a 43 y.o. male with a significant hx  of alcohol abuse brought in by EMS for alcohol intoxication and frequent falls.   Mother called EMS last night for concerning of frequent falls at home and repeated alcohol intoxication.  Patient presented with confusion and could  not remember what has happened.  Patient denied any headache double vision numbness weakness of any of the limbs and he could not  remember if he ever fell.  He admitted last drink was last nigh and he had about 16 beers.   Patient's mother reports he is not telling the truth, that he drinks more than 16 beers and uses liquors as well. She reports he also smokes Marijuana and vapes every day.  Face-to-face assessment: Pt seen in bed awake. Appears anxious and restless. Frequently falling asleep and waking up.  Thought process is coherent. CIWA 16: nausea, anxiety, sweating, restlessness,  generalized discomfort.  Alert and oriented x4.  Speech is pressured with decreased volume. Patient is knowledgeable of the situation and  states "I tripped and fell".  Admits to long hx of alcohol use and no triggers. Started drinking socially and ended up abusing.  Reports he usually drinks after work, in night. Denies additional psychiatric/mental illness. Denies hx of trauma. Denies abuse or neglect. Reports family is supportive. Denies  SI/HI/AVH. Patient reports no hx of treatment or rehab and  expresses no interest  for a long-term treatment for now. Pt reports his appetite is poor but he usually eats well. Reports he enjoys watching sports. CIWA protocol in process.     Primary Psychiatric Diagnoses  Alcohol Abuse   Assessment  Mark Gordon is a 43 y.o. male admitted: Medicallyfor 01/16/2024  3:43 AM  following a fall at home. Mark Gordon He carries the psychiatric diagnoses of Alcohol abuse and has no documented  past medical history.   His current presentation of anxiety/restlessness/helplessness is most consistent with his  alcohol intoxication. Currently on detox protocol.  Reports no  current outpatient psychotropic medications.  No home medications reported.  On initial examination, patient  appears anxious and restless, somewhat distractible. Please see plan below for detailed recommendations.   Diagnoses:  Active Hospital problems: Principal Problem:   SDH (subdural hematoma) (HCC) Active Problems:   Alcohol intoxication (HCC)   SAH (subarachnoid hemorrhage) (HCC)    Plan   ## Psychiatric Medication Recommendations:  Continue current treatment. Continue to monitor  ## Medical Decision Making Capacity: Not specifically addressed in this encounter  ## Further Work-up:  -- NA  -- most recent EKG : Not completed yet -- Pertinent labwork reviewed earlier this admission includes: All labs reviwed   ## Disposition:-- Plan Post Discharge/Psychiatric Care Follow-up resources for Substance abuse outpatient services/rehab programs  ##  Behavioral / Environmental: - No specific recommendations at this time.     ## Safety and Observation Level:  - Based on my clinical evaluation, I estimate the patient to be at low risk of self harm in the current setting. - At this time, we recommend  routine. This decision is based on my review of the chart including patient's history and current presentation, interview of the  patient, mental status examination, and consideration of suicide risk including evaluating suicidal ideation, plan, intent, suicidal or self-harm behaviors, risk factors, and protective factors. This judgment is based on our ability to directly address suicide risk, implement suicide prevention strategies, and develop a safety plan while the patient is in the clinical setting. Please contact our team if there is a concern that risk level has changed.  CSSR Risk Category:C-SSRS RISK CATEGORY: No Risk  Suicide Risk Assessment: Patient has following modifiable risk factors for suicide: lack of access to outpatient mental health resources, which we are addressing by recommending rehab services/outpatient treatment programs. Patient has following non-modifiable or demographic risk factors for suicide: male gender Patient has the following protective factors against suicide: Supportive family, no history of suicide attempts, and no history of NSSIB  Thank you for this consult request. Recommendations have been communicated to the primary team.  We will continue current treatment at this time.   Mark Halsted, NP       History of Present Illness  Relevant Aspects of [facility] Course:  Admitted on 01/16/2024 .   Patient Report:  "I tripped and fell"  Psych ROS:  Depression: Denies Anxiety:  current Mania (lifetime and current): NA Psychosis: (lifetime and current): NA  Collateral information:  Contacted : Mother at bedside  Review of Systems  Constitutional: Negative.   HENT: Negative.    Eyes: Negative.   Respiratory: Negative.    Cardiovascular: Negative.   Gastrointestinal: Negative.   Genitourinary: Negative.   Musculoskeletal: Negative.   Skin: Negative.   Neurological: Negative.   Endo/Heme/Allergies: Negative.   Psychiatric/Behavioral:  Positive for substance abuse. The patient is nervous/anxious.      Psychiatric and Social History  Psychiatric History:   Information collected from patient and mother  Prev Dx/Sx: Alcohol abuse Current Psych Provider: NA Home Meds (current): NA Previous Med Trials: NA Therapy: NA  Prior Psych Hospitalization: NA  Prior Self Harm: NA Prior Violence: NA  Family Psych History: Grandfather was alcoholic. Mother has MDD, GAD Family Hx suicide: NA  Social History:  Developmental Hx: NA Educational Hx: NA Occupational Hx: NA Legal Hx: NA Living Situation: Lives with mother Spiritual Hx: NA Access to weapons/lethal means: NA   Substance History Alcohol: current use  Type of alcohol Mainly beer but also liquor Last Drink Last night at 2 am Number of drinks per day 16 or more History of alcohol withdrawal seizures NA History of DT's NA Tobacco: Vapes Nicotine  Illicit drugs: Uses Marijuana Prescription drug abuse: NA Rehab hx: NA  Exam Findings  Physical Exam:  Vital Signs:  Temp:  [97.8 F (36.6 C)-98.5 F (36.9 C)] 98.5 F (36.9 C) (05/29 0845) Pulse Rate:  [85-99] 98 (05/29 0845) Resp:  [14-20] 16 (05/29 0845) BP: (123-151)/(74-96) 136/89 (05/29 0845) SpO2:  [95 %-100 %] 98 % (05/29 0845) Weight:  [134.9 kg] 134.9 kg (05/29 0351) Blood pressure 136/89, pulse 98, temperature 98.5 F (36.9 C), temperature source Oral, resp. rate 16, height 6\' 2"  (1.88 m), weight 134.9 kg, SpO2 98%. Body mass index is 38.18 kg/m.  Physical Exam Vitals reviewed.  Constitutional:      Appearance: He is obese. He is ill-appearing.  HENT:     Head: Normocephalic and atraumatic.     Left Ear: Tympanic membrane normal.     Nose: Nose normal.     Mouth/Throat:     Mouth: Mucous membranes are moist.  Eyes:     Extraocular Movements: Extraocular movements intact.     Pupils: Pupils are equal, round, and reactive to light.  Cardiovascular:     Rate and Rhythm: Normal rate.     Pulses: Normal pulses.  Pulmonary:     Effort: Pulmonary effort is normal.  Musculoskeletal:        General: Normal range of  motion.     Cervical back: Normal range of motion.  Neurological:     General: No focal deficit present.    Mental Status Exam: General Appearance: Disheveled  Orientation:  Full (Time, Place, and Person)  Memory:  Immediate;   Fair Recent;   Fair Remote;   Fair  Concentration:  Concentration: Poor and Attention Span: Poor  Recall:  Fair  Attention  Poor  Eye Contact:  Poor  Speech:  Pressured and Slow  Language:  Fair  Volume:  Decreased  Mood: Anxious  Affect:  Blunt  Thought Process:  Coherent  Thought Content:  WDL  Suicidal Thoughts:  No  Homicidal Thoughts:  No  Judgement:  Poor  Insight:  Fair  Psychomotor Activity:  Restlessness  Akathisia:  NA  Fund of Knowledge:  Fair      Assets:  Manufacturing systems engineer Desire for Improvement Housing Social Support  Cognition:  WNL  ADL's:  Intact  AIMS (if indicated):        Other History   These have been pulled in through the EMR, reviewed, and updated if appropriate.  Family History:  The patient's family history is not on file.  Medical History: History reviewed. No pertinent past medical history.  Surgical History: Past Surgical History:  Procedure Laterality Date   TYMPANOSTOMY TUBE PLACEMENT       Medications:   Current Facility-Administered Medications:    acetaminophen  (TYLENOL ) tablet 650 mg, 650 mg, Oral, Q6H PRN, [place], MD   folic acid (FOLVITE) tablet 1 mg, 1 mg, Oral, Daily, Antoniette Batty T, MD, 1 mg at 01/16/24 9147   levETIRAcetam (KEPPRA) tablet 500 mg, 500 mg, Oral, BID, Zhang, Herbie Loll T, MD   LORazepam (ATIVAN) tablet 1-4 mg, 1-4 mg, Oral, Q1H PRN **OR** LORazepam (ATIVAN) injection 1-4 mg, 1-4 mg, Intravenous, Q1H PRN, Antoniette Batty T, MD, 3 mg at 01/16/24 1540   multivitamin with minerals tablet 1 tablet, 1 tablet, Oral, Daily, Antoniette Batty T, MD, 1 tablet at 01/16/24 8295   nicotine (NICODERM CQ - dosed in mg/24 hours) patch 21 mg, 21 mg, Transdermal, Once, Ward, Kristen N, DO, 21 mg at  01/16/24 0459   ondansetron  (ZOFRAN ) injection 4 mg, 4 mg, Intravenous, Q6H PRN, Antoniette Batty T, MD, 4 mg at 01/16/24 1530   thiamine (VITAMIN B1) tablet 100 mg, 100 mg, Oral, Daily, 100 mg at 01/16/24 0928 **OR** thiamine (VITAMIN B1) injection 100 mg, 100 mg, Intravenous, Daily, Frank [place], MD  Allergies: No Known Allergies  Mark Halsted, NP

## 2024-01-16 NOTE — ED Triage Notes (Signed)
 Pt arrived via [facility] from home where his mother called out due to him intoxicated and constantly falling in floor. Pt admits to copious amounts of ETOH including beer and Vodka. EMS reports pt was initially uncooperative and was in floor with snoring respirations. Pt woke with sternal rub. Pt arrived to ED awake and alert with slurred speech and need for constant redirection. Vomit present on pts beard and on right hand.

## 2024-01-16 NOTE — H&P (Addendum)
 History and Physical    Mark Gordon GEX:[id number] DOB: 1980-09-26 DOA: 01/16/2024  PCP: Patient, No Pcp Per (Confirm with patient/family/NH records and if not entered, this has to be entered at [facility] point of entry) Patient coming from: Home  I have personally briefly reviewed patient's old medical records in [facility]  Chief Complaint: Feeling ok  HPI: Mark Gordon is a 43 y.o. male with medical history significant of alcohol abuse brought in by EMS for alcohol intoxication and frequent falls.  Mother called EMS last night for concerning of frequent falls at home and repeated alcohol intoxication.  Patient however is confused and does not remember what has happened.  Patient denied any headache double vision numbness weakness of any of the limbs and he cannot remember if he ever fell.  He admitted last drink was last nigh  ED Course: Afebrile, not tachycardia nonhypotensive down hypoxic.  CT head showed multiple SDH and SAH no midline shift or mass effect.  Blood work showed sodium 124 potassium 3.7, bicarb 17 BUN 7 creatinine 0.9 glucose 115 WBC 4.8 hemoglobin 16.  Review of Systems: Unable to perform, patient is rather confused.  History reviewed. No pertinent past medical history.  Past Surgical History:  Procedure Laterality Date   TYMPANOSTOMY TUBE PLACEMENT       reports that he has been smoking cigarettes. He has never used smokeless tobacco. He reports current alcohol use of about 24.0 standard drinks of alcohol per week. He reports current drug use. Drug: Marijuana.  No Known Allergies  History reviewed. No pertinent family history.   Prior to Admission medications   Medication Sig Start Date End Date Taking? Authorizing Provider  acetaminophen  (TYLENOL ) 325 MG tablet Take 650 mg by mouth every 4 (four) hours as needed.   Yes [provider]  ibuprofen  (ADVIL ) 200 MG tablet Take 400 mg by mouth every 6 (six) hours as needed.   Yes [provider]  ondansetron  (ZOFRAN -ODT) 4 MG disintegrating tablet Take 1 tablet (4 mg total) by mouth every 8 (eight) hours as needed. Patient not taking: Reported on 01/16/2024 05/27/23   Lind Repine, MD    Physical Exam: Vitals:   01/16/24 0525 01/16/24 0530 01/16/24 0630 01/16/24 0730  BP: (!) 145/96 (!) 146/91 (!) 151/92 123/74  Pulse: 98 98 93 99  Resp: 16 20 19 14   Temp:    97.8 F (36.6 C)  TempSrc:    Oral  SpO2: 100% 99% 95% 98%  Weight:      Height:        Constitutional: NAD, calm, comfortable Vitals:   01/16/24 0525 01/16/24 0530 01/16/24 0630 01/16/24 0730  BP: (!) 145/96 (!) 146/91 (!) 151/92 123/74  Pulse: 98 98 93 99  Resp: 16 20 19 14   Temp:    97.8 F (36.6 C)  TempSrc:    Oral  SpO2: 100% 99% 95% 98%  Weight:      Height:       Eyes: PERRL, lids and conjunctivae normal ENMT: Mucous membranes are moist. Posterior pharynx clear of any exudate or lesions.Normal dentition.  Neck: normal, supple, no masses, no thyromegaly Respiratory: clear to auscultation bilaterally, no wheezing, no crackles. Normal respiratory effort. No accessory muscle use.  Cardiovascular: Regular rate and rhythm, no murmurs / rubs / gallops. No extremity edema. 2+ pedal pulses. No carotid bruits.  Abdomen: no tenderness, no masses palpated. No hepatosplenomegaly. Bowel sounds positive.  Musculoskeletal: no clubbing / cyanosis. No joint deformity  upper and lower extremities. Good ROM, no contractures. Normal muscle tone.  Skin: no rashes, lesions, ulcers. No induration Neurologic: CN 2-12 grossly intact. Sensation intact, DTR normal. Strength 5/5 in all 4.  Psychiatric: Normal judgment and insight. Alert and oriented x 3. Normal mood.     Labs on Admission: I have personally reviewed following labs and imaging studies  CBC: Recent Labs  Lab 01/16/24 0401  WBC 4.8  NEUTROABS 3.5  HGB 16.4  HCT 45.8  MCV 97.7  PLT 121*   Basic Metabolic Panel: Recent Labs  Lab  01/16/24 0401  NA 124*  K 3.2*  CL 93*  CO2 17*  GLUCOSE 115*  BUN 7  CREATININE 0.96  CALCIUM 8.3*  MG 2.2   GFR: Estimated Creatinine Clearance: 145 mL/min (by C-G formula based on SCr of 0.96 mg/dL). Liver Function Tests: Recent Labs  Lab 01/16/24 0401  AST 35  ALT 34  ALKPHOS 86  BILITOT 1.1  PROT 6.8  ALBUMIN 3.7   No results for input(s): "LIPASE", "AMYLASE" in the last 168 hours. Recent Labs  Lab 01/16/24 0427  AMMONIA 20   Coagulation Profile: Recent Labs  Lab 01/16/24 0427  INR 1.1   Cardiac Enzymes: Recent Labs  Lab 01/16/24 0401  CKTOTAL 178   BNP (last 3 results) No results for input(s): "PROBNP" in the last 8760 hours. HbA1C: No results for input(s): "HGBA1C" in the last 72 hours. CBG: Recent Labs  Lab 01/16/24 0436  GLUCAP 127*   Lipid Profile: No results for input(s): "CHOL", "HDL", "LDLCALC", "TRIG", "CHOLHDL", "LDLDIRECT" in the last 72 hours. Thyroid Function Tests: No results for input(s): "TSH", "T4TOTAL", "FREET4", "T3FREE", "THYROIDAB" in the last 72 hours. Anemia Panel: No results for input(s): "VITAMINB12", "FOLATE", "FERRITIN", "TIBC", "IRON", "RETICCTPCT" in the last 72 hours. Urine analysis:    Component Value Date/Time   COLORURINE YELLOW (A) 01/16/2024 0450   APPEARANCEUR CLEAR (A) 01/16/2024 0450   APPEARANCEUR CLEAR 11/30/2014 1052   LABSPEC 1.009 01/16/2024 0450   LABSPEC 1.010 11/30/2014 1052   PHURINE 5.0 01/16/2024 0450   GLUCOSEU NEGATIVE 01/16/2024 0450   GLUCOSEU NEGATIVE 11/30/2014 1052   HGBUR NEGATIVE 01/16/2024 0450   BILIRUBINUR NEGATIVE 01/16/2024 0450   BILIRUBINUR NEGATIVE 11/30/2014 1052   KETONESUR NEGATIVE 01/16/2024 0450   PROTEINUR NEGATIVE 01/16/2024 0450   NITRITE NEGATIVE 01/16/2024 0450   LEUKOCYTESUR NEGATIVE 01/16/2024 0450   LEUKOCYTESUR NEGATIVE 11/30/2014 1052    Radiological Exams on Admission: CT HEAD WO CONTRAST ( ) Addendum Date: 01/16/2024 ADDENDUM REPORT: 01/16/2024  04:49 ADDENDUM: Study discussed by telephone with Dr. Starling Eck WARD on 01/16/2024 at 0437 hours. Electronically Signed   By: Marlise Simpers M.D.   On: 01/16/2024 04:49   Result Date: 01/16/2024 CLINICAL DATA:  43 year old male with recurrent falls. EXAM: CT HEAD WITHOUT CONTRAST TECHNIQUE: Contiguous axial images were obtained from the base of the skull through the vertex without intravenous contrast. RADIATION DOSE REDUCTION: This exam was performed according to the departmental dose-optimization program which includes automated exposure control, adjustment of the mA and/or kV according to patient size and/or use of iterative reconstruction technique. COMPARISON:  Head CT 05/27/2023. FINDINGS: Brain: Multifocal intracranial hemorrhage is detailed below. No significant midline shift and pre-existing mild asymmetry of the lateral ventricles (normal variant). No ventriculomegaly. Basilar cisterns remain patent. Supratentorial gray-white differentiation is stable. No cortically based acute infarct identified. Small right posteroinferior cerebellar hemorrhagic contusion (detailed below). No significant posterior fossa edema or mass effect. Vascular: Calcified atherosclerosis at the skull base.  No suspicious intracranial vascular hyperdensity. Skull: Right posterior, occipital nondepressed skull fracture tracking from below, medial to the right lambdoid suture into the right skull base and to the right jugular foramen (series 2, images 1, 19, 40). No 2nd skull fracture is identified. Sinuses/Orbits: Visualized paranasal sinuses and mastoids are stable and well aerated. Debris in the right external auditory canal. Other: Broad-based right posterior convexity scalp hematoma/contusion, 5-6 mm in thickness on series 2, image 29. Underlying nondepressed skull fracture. Orbits soft tissues not included. ---------------------------------------------------- Traumatic Brain Injury Risk Stratification Skull Fracture: Nondisplaced -  Moderate/mBIG 2 Subdural Hematoma (SDH): 4mm to <16mm - mBIG 2 Bilateral 4 mm mixed density subdural hematoma (series 3, image 12, coronal images 18, 23, 30). Subarachnoid Hemorrhage [facility]): multifocal, bilateral - High/mBIG 3 Left sylvian fissure SAH is most apparent, but there is evidence of anterior bifrontal subarachnoid blood also (on the right series 5, image 21). Epidural Hematoma (EDH): No - Low/mBIG 1 Cerebral contusion, intra-axial, intraparenchymal Hemorrhage (IPH): 8mm plus or multiple - High/mBIG 3 10 mm right posterior cerebellar hemisphere hemorrhagic contusion on series 3, image 4. Intraventricular Hemorrhage (IVH): No - Low/mBIG 1 Midline Shift > 1mm or Edema/effacement of sulci/vents: No - Low/mBIG 1 ---------------------------------------------------- IMPRESSION: 1. Positive for multifocal posttraumatic intracranial hemorrhage: - bilateral mixed density SDH, 4 mm. - scattered bilateral SAH, most apparent in the left sylvian fissure. - right posterior cerebellar hemorrhagic contusion, 10 mm. 2. Nondepressed right posterior convexity skull fracture tracking into the right skull base, right jugular foramen. 3. No midline shift or significant intracranial mass effect at this time. Electronically Signed: By: Marlise Simpers M.D. On: 01/16/2024 04:32   CT Cervical Spine Wo Contrast Result Date: 01/16/2024 CLINICAL DATA:  43 year old male with recurrent falls. EXAM: CT CERVICAL SPINE WITHOUT CONTRAST TECHNIQUE: Multidetector CT imaging of the cervical spine was performed without intravenous contrast. Multiplanar CT image reconstructions were also generated. RADIATION DOSE REDUCTION: This exam was performed according to the departmental dose-optimization program which includes automated exposure control, adjustment of the mA and/or kV according to patient size and/or use of iterative reconstruction technique. COMPARISON:  Head CT today.  Cervical spine CT 05/27/2023. FINDINGS: Alignment: Chronic straightening of  cervical lordosis, mild reversal nail. Cervicothoracic junction alignment is within normal limits. Bilateral posterior element alignment is within normal limits. Skull base and vertebrae: Mild motion artifact. Linear nondepressed right skull base fracture tracking from the right occipital bone through the posterior fossa and terminates at the right jugular foramen. See Head CT reported separately. Maintained craniocervical alignment. C1-C2 alignment appears maintained. No cervical vertebral fracture identified when allowing for motion artifact. Soft tissues and spinal canal: No prevertebral fluid or swelling. No visible canal hematoma. Negative visible noncontrast neck soft tissues when allowing for motion artifact. Disc levels: Intermittent chronic cervical disc and endplate degeneration appears stable from last year. Upper chest: Grossly intact visible upper thoracic levels, negative noncontrast thoracic inlet. IMPRESSION: 1. Mild motion artifact. No acute traumatic injury identified in the cervical spine. 2. Right skull base fracture, see abnormal Head CT reported separately. Electronically Signed   By: Marlise Simpers M.D.   On: 01/16/2024 04:35    EKG: Independently reviewed.  Sinus rhythm, no acute ST changes.  Assessment/Plan Principal Problem:   SDH (subdural hematoma) (HCC) Active Problems:   Alcohol intoxication (HCC)   SAH (subarachnoid hemorrhage) (HCC)  (please populate well all problems here in Problem List. (For example, if patient is on BP meds at home and you resume or decide to  hold them, it is a problem that needs to be her. Same for CAD, COPD, HLD and so on)  Acute subdural hematoma Acute subarachnoid hemorrhage - Secondary to mechanical fall from alcohol intoxication - Neurosurgeon reviewed the CT image, impression is there is no indication for surgical intervention at this point.  Repeat CT scan 6 hours after initial CT last night is pending. - Frequent neurochecks for 1 day -Seizure  precaution, fall precaution -Aim for BP control 140/90, add PRN Hydralazine - As per recommendation from neurosurgeon, prophylactic dosage of Keppra  500 mg twice daily x 7 days - No chemical DVT prophylaxis  Acute, probably on chronic hyponatremia - Secondary to alcohol abuse and beer potomania - Euvolemic, no focal neurological deficit, no indication for hypertonic saline or emergent correction - Fluid restriction - Repeat sodium level this morning and this afternoon  Hypokalemia - IV and p.o. replacement  Alcohol intoxication - Currently there is no symptoms or signs of active alcohol withdrawal - Start CIWA protocol with as needed benzos  Cigarette smoking - Nicotine  patch - Cessation education performed at bedside  Delirium - Patient threatened to leave the hospital this morning for several times - One-to-one for safety - IVC by ED physician  DVT prophylaxis: SCD Code Status: Full code Family Communication: None at bedside Disposition Plan: Patient is sick with SDH and SAH from frequent falls associated with alcohol intoxication, hyponatremia requiring slow correction, requiring close clinical monitoring, expect more than 2 midnight hospital stay Consults called: Neuro surgery Admission status: PCU admit   Frank [place] MD [facility] Pager [phone]  01/16/2024, 8:38 AM

## 2024-01-16 NOTE — Progress Notes (Signed)
 Asked by EDP to evaluate patient for ICU admission. Patient presented with main complaint of alcohol intoxication and fall.  Upon arrival to ED patient visibly intoxicated with mother reporting multiple falls in the previous 24 hours.  Imaging revealed bilateral mixed density SDH, 4 mm and scattered bilateral SAH with right posterior cerebellar hemorrhagic contusion, 10 mm and a non depressed right posterior convexity skull fracture. Neurosurgery consulted, who did not feel injuries found on CT alone would require ICU admission and recommended AED with follow up CTH.   Upon bedside assessment, patient is Alert and responsive, remains visibly intoxicated, but able to converse easily. Vitals are stable, not on vasopressor support, on RA.   After reviewing labs, vitals and patient presentation- patient does not meet ICU admission criteria at this time. And neurosurgery agrees that intracranial injuries do not require ICU admission.  Please re-consult if needed in the future.       Recardo Canal Rust-Chester, AGACNP-BC Acute Care Nurse Practitioner [facility]    [phone] / [phone] Please see Amion for pager details.

## 2024-01-16 NOTE — ED Notes (Signed)
 Patient was en route to room 245 when the Secretary Edwina Gram realized that patient's bed had been rejected with the reasoning of "floor has no IVC sitter." Patient placed back in ED 26 and made sure was still on the monitor.

## 2024-01-16 NOTE — Consult Note (Signed)
 Consult requested by:  Dr. Author Board  Consult requested for:  Subdural hematoma  Primary Physician:  Patient, No Pcp Per  History of Present Illness: 01/16/2024 [name] is here today with a chief complaint of fall.  He was found by his mother after a fall and being unresponsive.  He had been drinking.  He reports ongoing alcohol use to excess since age 43.    He denies headache, nausea, or vomiting.  He does not recall the events of the evening.  I have utilized the care everywhere function in epic to review the outside records available from external health systems.  Review of Systems:  A 10 point review of systems is negative, except for the pertinent positives and negatives detailed in the HPI.  Past Medical History: History reviewed. No pertinent past medical history.  Past Surgical History: Past Surgical History:  Procedure Laterality Date   TYMPANOSTOMY TUBE PLACEMENT      Allergies: Allergies as of 01/16/2024   (No Known Allergies)    Medications: Current Meds  Medication Sig   acetaminophen  (TYLENOL ) 325 MG tablet Take 650 mg by mouth every 4 (four) hours as needed.   ibuprofen  (ADVIL ) 200 MG tablet Take 400 mg by mouth every 6 (six) hours as needed.    Social History: Social History   Tobacco Use   Smoking status: Every Day    Current packs/day: 0.25    Types: Cigarettes   Smokeless tobacco: Never  Vaping Use   Vaping status: Never Used  Substance Use Topics   Alcohol use: Yes    Alcohol/week: 24.0 standard drinks of alcohol    Types: 24 Cans of beer per week   Drug use: Yes    Types: Marijuana    Family Medical History: History reviewed. No pertinent family history.  Physical Examination: Vitals:   01/16/24 0630 01/16/24 0730  BP: (!) 151/92 123/74  Pulse: 93 99  Resp: 19 14  Temp:  97.8 F (36.6 C)  SpO2: 95% 98%    General: Patient is in no apparent distress. He is still intoxicated but able to discuss his current situation.    Neck:   Supple.  Full range of motion.  Respiratory: Patient is breathing without any difficulty.   NEUROLOGICAL:     Awake, alert, oriented to person, place, and time.  Speech is slightly slurred.  Cranial Nerves: Pupils equal round and reactive to light.  Facial tone is symmetric.  Facial sensation is symmetric. Shoulder shrug is symmetric. Tongue protrusion is midline.  There is no pronator drift.  Strength: Side Biceps Triceps Deltoid Interossei Grip Wrist Ext. Wrist Flex.  R 5 5 5 5 5 5 5   L 5 5 5 5 5 5 5    Side Iliopsoas Quads Hamstring PF DF EHL  R 5 5 5 5 5 5   L 5 5 5 5 5 5    Reflexes are 1+ and symmetric at the biceps, triceps, brachioradialis, patella and achilles.   Hoffman's is absent.   Bilateral upper and lower extremity sensation is intact to light touch.    No evidence of dysmetria noted.  Gait is untested.     Medical Decision Making  Imaging: CT Head 01/16/2024 IMPRESSION: 1. Positive for multifocal posttraumatic intracranial hemorrhage: - bilateral mixed density SDH, 4 mm. - scattered bilateral SAH, most apparent in the left sylvian fissure. - right posterior cerebellar hemorrhagic contusion, 10 mm. 2. Nondepressed right posterior convexity skull fracture tracking into the right skull base,  right jugular foramen. 3. No midline shift or significant intracranial mass effect at this time.   Electronically Signed: By: Marlise Simpers M.D. On: 01/16/2024 04:32  I have personally reviewed the images and agree with the above interpretation.  Assessment and Plan: [name] is a pleasant 43 y.o. male with minor traumatic head injury GCS 15. He is still metabolizing the alcohol he consumed.  He is being admitted for medical concerns.  - Repeat HCT 6 hours after initial - Keppra or other AED for 1 week - OK to follow up as outpatient if HCT stable    I have communicated my recommendations to the requesting physician and coordinated care to facilitate  these recommendations.     Desyre Calma K. Mont Antis MD, [facility]

## 2024-01-16 NOTE — ED Provider Notes (Signed)
 [facility] Provider Note    Event Date/Time   First MD Initiated Contact with Patient 01/16/24 [phone]     (approximate)   History   Alcohol Intoxication and Fall   HPI  Mark Gordon is a 43 y.o. male with unknown past medical history who presents to the emergency department with EMS from home after his mother called 911 due to patient falling and then being unresponsive on the floor.  They report patient did drink 15 beers and a bottle of vodka today.  Patient has nonsensical speech intermittently but can tell us  his name.  He is awake, alert.   Mother denies any history of drug use but states he does drink alcohol every day.  No history of withdrawal seizure.  She reports multiple falls in the past 24 hours.  States he has been complaining of right-sided headache and difficulty hearing out of the right ear.  History provided by patient, EMS, mother by phone.    History reviewed. No pertinent past medical history.  Past Surgical History:  Procedure Laterality Date   TYMPANOSTOMY TUBE PLACEMENT      MEDICATIONS:  Prior to Admission medications   Medication Sig Start Date End Date Taking? Authorizing Provider  ondansetron  (ZOFRAN -ODT) 4 MG disintegrating tablet Take 1 tablet (4 mg total) by mouth every 8 (eight) hours as needed. 05/27/23   Lind Repine, MD    Physical Exam   Triage Vital Signs: ED Triage Vitals  Encounter Vitals Group     BP 01/16/24 0347 132/88     Systolic BP Percentile --      Diastolic BP Percentile --      Pulse Rate 01/16/24 0347 85     Resp 01/16/24 0347 14     Temp 01/16/24 0347 98.5 F (36.9 C)     Temp Source 01/16/24 0347 Oral     SpO2 01/16/24 0347 99 %     Weight --      Height --      Head Circumference --      Peak Flow --      Pain Score 01/16/24 0350 0     Pain Loc --      Pain Education --      Exclude from Growth Chart --     Most recent vital signs: Vitals:   01/16/24 0525 01/16/24  0530  BP: (!) 145/96 (!) 146/91  Pulse: 98 98  Resp: 16 20  Temp:    SpO2: 100% 99%    CONSTITUTIONAL: Alert, responds to some questions appropriately but appears intoxicated with slurred, nonsensical speech intermittently.  Smells strongly of alcohol. HEAD: Normocephalic, atraumatic EYES: Conjunctivae clear, pupils appear equal, sclera nonicteric ENT: normal nose; moist mucous membranes NECK: Supple, normal ROM, no midline step-off or deformity CARD: RRR; S1 and S2 appreciated RESP: Normal chest excursion without splinting or tachypnea; breath sounds clear and equal bilaterally; no wheezes, no rhonchi, no rales, no hypoxia or respiratory distress, speaking full sentences ABD/GI: Non-distended; soft, non-tender, no rebound, no guarding, no peritoneal signs BACK: The back appears normal EXT: Normal ROM in all joints; no deformity noted, no edema SKIN: Normal color for age and race; warm; no rash on exposed skin NEURO: Moves all extremities equally, slurred speech, no obvious facial asymmetry    ED Results / Procedures / Treatments   LABS: (all labs ordered are listed, but only abnormal results are displayed) Labs Reviewed  CBC WITH DIFFERENTIAL/PLATELET - Abnormal; Notable for the  following components:      Result Value   MCH 35.0 (*)    Platelets 121 (*)    All other components within normal limits  COMPREHENSIVE METABOLIC PANEL WITH GFR - Abnormal; Notable for the following components:   Sodium 124 (*)    Potassium 3.2 (*)    Chloride 93 (*)    CO2 17 (*)    Glucose, Bld 115 (*)    Calcium 8.3 (*)    All other components within normal limits  URINALYSIS, ROUTINE W REFLEX MICROSCOPIC - Abnormal; Notable for the following components:   Color, Urine YELLOW (*)    APPearance CLEAR (*)    All other components within normal limits  URINE DRUG SCREEN, QUALITATIVE ([facility] ONLY) - Abnormal; Notable for the following components:   Cannabinoid 50 Ng, Ur [place] POSITIVE (*)    All other  components within normal limits  ACETAMINOPHEN  LEVEL - Abnormal; Notable for the following components:   Acetaminophen  (Tylenol ), Serum <10 (*)    All other components within normal limits  SALICYLATE LEVEL - Abnormal; Notable for the following components:   Salicylate Lvl <7.0 (*)    All other components within normal limits  ETHANOL - Abnormal; Notable for the following components:   Alcohol, Ethyl (B) 286 (*)    All other components within normal limits  CBG MONITORING, ED - Abnormal; Notable for the following components:   Glucose-Capillary 127 (*)    All other components within normal limits  MAGNESIUM  CK  AMMONIA  PROTIME-INR     EKG:  EKG Interpretation Date/Time:  Thursday Jan 16 2024 03:52:42 EDT Ventricular Rate:  85 PR Interval:  170 QRS Duration:  113 QT Interval:  397 QTC Calculation: 473 R Axis:   -2  Text Interpretation: Sinus rhythm Borderline intraventricular conduction delay Low voltage, precordial leads Confirmed by Verneda Golder [phone]) on 01/16/2024 3:56:07 AM         RADIOLOGY: My personal review and interpretation of imaging: CT head shows a skull fracture, intracranial hemorrhage  I have personally reviewed all radiology reports.   CT HEAD WO CONTRAST ( ) Addendum Date: 01/16/2024 ADDENDUM REPORT: 01/16/2024 04:49 ADDENDUM: Study discussed by telephone with Dr. Starling Eck Alyshia Kernan on 01/16/2024 at 0437 hours. Electronically Signed   By: Marlise Simpers M.D.   On: 01/16/2024 04:49   Result Date: 01/16/2024 CLINICAL DATA:  43 year old male with recurrent falls. EXAM: CT HEAD WITHOUT CONTRAST TECHNIQUE: Contiguous axial images were obtained from the base of the skull through the vertex without intravenous contrast. RADIATION DOSE REDUCTION: This exam was performed according to the departmental dose-optimization program which includes automated exposure control, adjustment of the mA and/or kV according to patient size and/or use of iterative reconstruction technique.  COMPARISON:  Head CT 05/27/2023. FINDINGS: Brain: Multifocal intracranial hemorrhage is detailed below. No significant midline shift and pre-existing mild asymmetry of the lateral ventricles (normal variant). No ventriculomegaly. Basilar cisterns remain patent. Supratentorial gray-white differentiation is stable. No cortically based acute infarct identified. Small right posteroinferior cerebellar hemorrhagic contusion (detailed below). No significant posterior fossa edema or mass effect. Vascular: Calcified atherosclerosis at the skull base. No suspicious intracranial vascular hyperdensity. Skull: Right posterior, occipital nondepressed skull fracture tracking from below, medial to the right lambdoid suture into the right skull base and to the right jugular foramen (series 2, images 1, 19, 40). No 2nd skull fracture is identified. Sinuses/Orbits: Visualized paranasal sinuses and mastoids are stable and well aerated. Debris in the right external auditory canal. Other:  Broad-based right posterior convexity scalp hematoma/contusion, 5-6 mm in thickness on series 2, image 29. Underlying nondepressed skull fracture. Orbits soft tissues not included. ---------------------------------------------------- Traumatic Brain Injury Risk Stratification Skull Fracture: Nondisplaced - Moderate/mBIG 2 Subdural Hematoma (SDH): 4mm to <38mm - mBIG 2 Bilateral 4 mm mixed density subdural hematoma (series 3, image 12, coronal images 18, 23, 30). Subarachnoid Hemorrhage [facility]): multifocal, bilateral - High/mBIG 3 Left sylvian fissure SAH is most apparent, but there is evidence of anterior bifrontal subarachnoid blood also (on the right series 5, image 21). Epidural Hematoma (EDH): No - Low/mBIG 1 Cerebral contusion, intra-axial, intraparenchymal Hemorrhage (IPH): 8mm plus or multiple - High/mBIG 3 10 mm right posterior cerebellar hemisphere hemorrhagic contusion on series 3, image 4. Intraventricular Hemorrhage (IVH): No - Low/mBIG 1  Midline Shift > 1mm or Edema/effacement of sulci/vents: No - Low/mBIG 1 ---------------------------------------------------- IMPRESSION: 1. Positive for multifocal posttraumatic intracranial hemorrhage: - bilateral mixed density SDH, 4 mm. - scattered bilateral SAH, most apparent in the left sylvian fissure. - right posterior cerebellar hemorrhagic contusion, 10 mm. 2. Nondepressed right posterior convexity skull fracture tracking into the right skull base, right jugular foramen. 3. No midline shift or significant intracranial mass effect at this time. Electronically Signed: By: Marlise Simpers M.D. On: 01/16/2024 04:32   CT Cervical Spine Wo Contrast Result Date: 01/16/2024 CLINICAL DATA:  43 year old male with recurrent falls. EXAM: CT CERVICAL SPINE WITHOUT CONTRAST TECHNIQUE: Multidetector CT imaging of the cervical spine was performed without intravenous contrast. Multiplanar CT image reconstructions were also generated. RADIATION DOSE REDUCTION: This exam was performed according to the departmental dose-optimization program which includes automated exposure control, adjustment of the mA and/or kV according to patient size and/or use of iterative reconstruction technique. COMPARISON:  Head CT today.  Cervical spine CT 05/27/2023. FINDINGS: Alignment: Chronic straightening of cervical lordosis, mild reversal nail. Cervicothoracic junction alignment is within normal limits. Bilateral posterior element alignment is within normal limits. Skull base and vertebrae: Mild motion artifact. Linear nondepressed right skull base fracture tracking from the right occipital bone through the posterior fossa and terminates at the right jugular foramen. See Head CT reported separately. Maintained craniocervical alignment. C1-C2 alignment appears maintained. No cervical vertebral fracture identified when allowing for motion artifact. Soft tissues and spinal canal: No prevertebral fluid or swelling. No visible canal hematoma. Negative  visible noncontrast neck soft tissues when allowing for motion artifact. Disc levels: Intermittent chronic cervical disc and endplate degeneration appears stable from last year. Upper chest: Grossly intact visible upper thoracic levels, negative noncontrast thoracic inlet. IMPRESSION: 1. Mild motion artifact. No acute traumatic injury identified in the cervical spine. 2. Right skull base fracture, see abnormal Head CT reported separately. Electronically Signed   By: Marlise Simpers M.D.   On: 01/16/2024 04:35     PROCEDURES:  Critical Care performed: Yes, see critical care procedure note(s)   CRITICAL CARE Performed by: Verneda Golder   Total critical care time: 36 minutes  Critical care time was exclusive of separately billable procedures and treating other patients.  Critical care was necessary to treat or prevent imminent or life-threatening deterioration.  Critical care was time spent personally by me on the following activities: development of treatment plan with patient and/or surrogate as well as nursing, discussions with consultants, evaluation of patient's response to treatment, examination of patient, obtaining history from patient or surrogate, ordering and performing treatments and interventions, ordering and review of laboratory studies, ordering and review of radiographic studies, pulse oximetry and re-evaluation of patient's  condition.   [name]1-3 Lead EKG Interpretation  Performed by: Chriselda Leppert, Clover Dao, DO Authorized by: Laurin Paulo, Clover Dao, DO     Interpretation: normal     ECG rate:  98   ECG rate assessment: normal     Rhythm: sinus rhythm     Ectopy: none     Conduction: normal       IMPRESSION / MDM / ASSESSMENT AND PLAN / ED COURSE  I reviewed the triage vital signs and the nursing notes.    Patient here with alcohol intoxication, fall, head injury, altered mental status.  The patient is on the cardiac monitor to evaluate for evidence of arrhythmia and/or significant heart  rate changes.   DIFFERENTIAL DIAGNOSIS (includes but not limited to):   Alcohol intoxication, polysubstance use disorder, intracranial hemorrhage, dehydration, electrolyte derangement, UTI, stroke, Wernicke's encephalopathy, hepatic encephalopathy   Patient's presentation is most consistent with acute presentation with potential threat to life or bodily function.   PLAN: Will obtain labs, urine, CT head and cervical spine.  Will give IV fluids, Zofran , thiamine.   MEDICATIONS GIVEN IN ED: Medications  nicotine (NICODERM CQ - dosed in mg/24 hours) patch 21 mg (21 mg Transdermal Patch Applied 01/16/24 0459)  0.9 %  sodium chloride  infusion ( Intravenous New Bag/Given 01/16/24 0533)  potassium chloride 10 mEq in 100 mL IVPB (10 mEq Intravenous New Bag/Given 01/16/24 0542)  sodium chloride  0.9 % bolus 1,000 mL (0 mLs Intravenous Stopped 01/16/24 0458)  ondansetron  (ZOFRAN ) injection 4 mg (4 mg Intravenous Given 01/16/24 0429)  thiamine (VITAMIN B1) injection 100 mg (100 mg Intravenous Given 01/16/24 0430)  levETIRAcetam (KEPPRA) tablet 500 mg (500 mg Oral Given 01/16/24 0502)  LORazepam (ATIVAN) injection 1 mg (1 mg Intravenous Given 01/16/24 0503)     ED COURSE: CT head, cervical spine reviewed and interpreted by myself and the radiologist.  Patient has bilateral subarachnoid hemorrhages, subdural hemorrhages and a nondisplaced skull fracture.  Will discuss with neurosurgery.   5:37 AM  Pt's labs show sodium of 124 likely from beer potomania.  Negative Tylenol , salicylate level.  Normal ammonia.  Ethanol level pending.  Recommended admission for hyponatremia and further monitoring.  Will give IV fluids.  Patient refusing admission but at this time I do not feel he has capacity to make these decisions for himself given he is confused, intoxicated.  I have placed him under involuntary commitment.  I did speak to his mother by phone and updated her with this plan.  She agrees.  CONSULTS:  5:00 AM   Discussed with Dr. Mont Antis on call for NSG.  Appreciate his help.  Recommends Keppra 500 mg BID x 7 days for seizure prophylaxis, repeat head CT in 6 hours.     5:48 AM  Consulted and discussed patient's case with hospitalist, Dr. Achilles Holes.  He requests ICU consult for admission.   6:28 AM  Discussed with Recardo Canal Sarina Curb NP with ICU.  She will see patient in consultation but feels patient would be appropriate to be admitted to the hospitalist service to stepdown.  Discussed with Dr. Achilles Holes who agrees.    I have recommended admission and consulting physician agrees and will place admission orders.  Patient (and family if present) agree with this plan.   I reviewed all nursing notes, vitals, pertinent previous records.  All labs, EKGs, imaging ordered have been independently reviewed and interpreted by myself.   OUTSIDE RECORDS REVIEWED:  Reviewed last urgent care note in June 2024.  FINAL CLINICAL IMPRESSION(S) / ED DIAGNOSES   Final diagnoses:  Alcoholic intoxication with complication (HCC)  SAH (subarachnoid hemorrhage) (HCC)  SDH (subdural hematoma) (HCC)  Skull fracture with cerebral contusion, closed, initial encounter (HCC)  Hyponatremia     Rx / DC Orders   ED Discharge Orders     None        Note:  This document was prepared using Dragon voice recognition software and may include unintentional dictation errors.   Jayzen Paver, Clover Dao, DO 01/16/24 [phone]

## 2024-01-17 ENCOUNTER — Inpatient Hospital Stay: Payer: MEDICAID

## 2024-01-17 DIAGNOSIS — F17210 Nicotine dependence, cigarettes, uncomplicated: Secondary | ICD-10-CM

## 2024-01-17 DIAGNOSIS — E66812 Obesity, class 2: Secondary | ICD-10-CM

## 2024-01-17 DIAGNOSIS — I609 Nontraumatic subarachnoid hemorrhage, unspecified: Secondary | ICD-10-CM

## 2024-01-17 DIAGNOSIS — E876 Hypokalemia: Secondary | ICD-10-CM

## 2024-01-17 DIAGNOSIS — E871 Hypo-osmolality and hyponatremia: Secondary | ICD-10-CM

## 2024-01-17 DIAGNOSIS — F10921 Alcohol use, unspecified with intoxication delirium: Secondary | ICD-10-CM

## 2024-01-17 LAB — CBC
HCT: 45.7 % (ref 39.0–52.0)
Hemoglobin: 16.6 g/dL (ref 13.0–17.0)
MCH: 35.8 pg — ABNORMAL HIGH (ref 26.0–34.0)
MCHC: 36.3 g/dL — ABNORMAL HIGH (ref 30.0–36.0)
MCV: 98.5 fL (ref 80.0–100.0)
Platelets: 120 10*3/uL — ABNORMAL LOW (ref 150–400)
RBC: 4.64 MIL/uL (ref 4.22–5.81)
RDW: 13.4 % (ref 11.5–15.5)
WBC: 6.9 10*3/uL (ref 4.0–10.5)
nRBC: 0 % (ref 0.0–0.2)

## 2024-01-17 LAB — BASIC METABOLIC PANEL WITH GFR
Anion gap: 10 (ref 5–15)
BUN: 8 mg/dL (ref 6–20)
CO2: 24 mmol/L (ref 22–32)
Calcium: 9.4 mg/dL (ref 8.9–10.3)
Chloride: 96 mmol/L — ABNORMAL LOW (ref 98–111)
Creatinine, Ser: 1.07 mg/dL (ref 0.61–1.24)
GFR, Estimated: >60 mL/min (ref >60)
Glucose, Bld: 96 mg/dL (ref 70–99)
Potassium: 4.1 mmol/L (ref 3.5–5.1)
Sodium: 130 mmol/L — ABNORMAL LOW (ref 135–145)

## 2024-01-17 LAB — HIV ANTIBODY (ROUTINE TESTING W REFLEX): HIV Screen 4th Generation wRfx: NONREACTIVE

## 2024-01-17 MED ORDER — SODIUM CHLORIDE 0.9 % IV SOLN: INTRAVENOUS | Status: DC

## 2024-01-17 NOTE — Assessment & Plan Note (Signed)
 Estimated body mass index is 38.18 kg/m as calculated from the following:   Height as of this encounter: 6\' 2"  (1.88 m).   Weight as of this encounter: 134.9 kg.   - Encouraged weight loss

## 2024-01-17 NOTE — Plan of Care (Signed)
  Problem: Clinical Measurements: Goal: Ability to maintain clinical measurements within normal limits will improve Outcome: Progressing Goal: Will remain free from infection Outcome: Progressing Goal: Cardiovascular complication will be avoided Outcome: Progressing   Problem: Safety: Goal: Ability to remain free from injury will improve Outcome: Progressing

## 2024-01-17 NOTE — Progress Notes (Signed)
 Progress Note   Patient: Mark Gordon ZOX:[id number] DOB: 1980-08-27 DOA: 01/16/2024     1 DOS: the patient was seen and examined on 01/17/2024   Brief hospital course: Taken from H&P.  Jmari Pelc is a 43 y.o. male with medical history significant of alcohol abuse brought in by EMS for alcohol intoxication and frequent falls.   On presentation vital stable, labs pertinent for hyponatremia with sodium at 124, bicarb 17.CT head showed multiple SDH and SAH no midline shift or mass effect.   Neurosurgery was consulted and CT head was repeated 6 hours later which shows stable SAH and slight increase in SDH.  Neurosurgery recommending conservative management. CTA head and neck was negative for any traumatic vascular injury or any proximal intracranial LVO. Started on Keppra for seizure prevention for 1 week. Patient was threatening to leave so [facility] by ED provider for safety.  5/30: Vital stable, improving sodium, currently at 130, stable thrombocytopenia with platelet at 120.  Elevated ethanol levels at 286 and UDS positive for cannabinoid, magnesium, ammonia, salicylate and Tylenol  levels were normal.  CIWA score of 4.  Sitter at bedside Repeat CT head this morning seems stable.  Assessment and Plan: * SDH (subdural hematoma) (HCC) Subarachnoid hemorrhage. Multiples subarachnoid and subdural hematomas with history of alcohol intoxication and frequent falls.  CTA of head and neck negative for any vascular injury.  Also shows stable nondisplaced right occipital bone fracture, there was also concern of questionable small fracture involving temporal bone. Neurosurgery is on board-think conservative management -Continue with Keppra for 7 days for seizure prophylaxis - Continue to monitor  Alcohol intoxication (HCC) Elevated ethanol levels.  Patient with increased insomnia and delirium secondary to alcohol intoxication.  Currently IVC and a sitter in place. -Continue with CIWA  protocol - Counseling was provided  Hyponatremia Likely acute on chronic with history of alcohol abuse. Responded to IV fluid and current sodium is 130. - Giving some more normal saline due to poor p.o. intake at this time. - Monitor sodium  Hypokalemia Resolved with repletion.  Magnesium was normal -Continue to monitor and replete as needed  Cigarette smoker - Nicotine patch as needed -Counseling was provided  Obesity, Class II, BMI 35-39.9 Estimated body mass index is 38.18 kg/m as calculated from the following:   Height as of this encounter: 6\' 2"  (1.88 m).   Weight as of this encounter: 134.9 kg.   - Encouraged weight loss   Subjective: Patient was little somnolent and wants to get some sleep when seen today.  Denies any pain.  Becoming little irritable when tried to consult for alcohol cessation.  Physical Exam: Vitals:   01/17/24 0327 01/17/24 0353 01/17/24 0829 01/17/24 1233  BP:  134/77 139/83 (!) 134/93  Pulse: 80 77 74 63  Resp: 19  20 20   Temp: 98.8 F (37.1 C)  98 F (36.7 C) 98.4 F (36.9 C)  TempSrc:      SpO2: 98%  92% 96%  Weight:      Height:       General.  Obese gentleman, in no acute distress. Pulmonary.  Lungs clear bilaterally, normal respiratory effort. CV.  Regular rate and rhythm, no JVD, rub or murmur. Abdomen.  Soft, nontender, nondistended, BS positive. CNS.  Alert and oriented .  No focal neurologic deficit. Extremities.  No edema, no cyanosis, pulses intact and symmetrical.  Data Reviewed: Prior data reviewed  Family Communication: Talked with mother on phone.  Disposition: Status is: Inpatient Remains  inpatient appropriate because: Severity of illness  Planned Discharge Destination: Home   Time spent: 50 minutes  This record has been created using Conservation officer, historic buildings. Errors have been sought and corrected,but may not always be located. Such creation errors do not reflect on the standard of care.    Author: Luna Salinas, MD 01/17/2024 1:09 PM  For on call review [email or web address].

## 2024-01-17 NOTE — TOC Initial Note (Signed)
 Transition of Care [facility]) - Initial/Assessment Note    Patient Details  Name: Susie Pousson MRN: [id number] Date of Birth: 05-20-1981  Transition of Care [facility]) CM/SW Contact:    Marino Sias, RN Phone Number: 01/17/2024, 3:55 PM  Clinical Narrative: Attempted to speak with patient at bedside, he replied that he was sleepy. Sitter at bedside. Discussed SA with patient who declines services, however receptive to reviewing it at another time. SA services added to AVS.                       Patient Goals and CMS Choice            Expected Discharge Plan and Services                                              Prior Living Arrangements/Services                       Activities of Daily Living      Permission Sought/Granted                  Emotional Assessment              Admission diagnosis:  Hyponatremia [E87.1] SAH (subarachnoid hemorrhage) (HCC) [I60.9] SDH (subdural hematoma) (HCC) [S06.5XAA] Skull fracture with cerebral contusion, closed, initial encounter (HCC) [S02.91XA, S06.33AA] Alcoholic intoxication with complication [facility]) [F10.929] Patient Active Problem List   Diagnosis Date Noted   Hyponatremia 01/17/2024   Hypokalemia 01/17/2024   Cigarette smoker 01/17/2024   Obesity, Class II, BMI 35-39.9 01/17/2024   SDH (subdural hematoma) (HCC) 01/16/2024   Alcohol intoxication (HCC) 01/16/2024   SAH (subarachnoid hemorrhage) (HCC) 01/16/2024   PCP:  Patient, No Pcp Per Pharmacy:   [id number] - [place], [place] - [address] [place] [address] Phone: [phone] Fax: [phone]  [id number] [place], [place] - [address] AT [facility] OF [address] [facility] [address] [place] [address] Phone: [phone] Fax: [phone]     Social Drivers of Health (SDOH) Social History: SDOH Screenings   Tobacco Use: High Risk (01/16/2024)   SDOH Interventions:      Readmission Risk Interventions     No data to display

## 2024-01-17 NOTE — Hospital Course (Addendum)
 Taken from H&P.  Mark Gordon is a 43 y.o. male with medical history significant of alcohol abuse brought in by EMS for alcohol intoxication and frequent falls.   On presentation vital stable, labs pertinent for hyponatremia with sodium at 124, bicarb 17.CT head showed multiple SDH and SAH no midline shift or mass effect.   Neurosurgery was consulted and CT head was repeated 6 hours later which shows stable SAH and slight increase in SDH.  Neurosurgery recommending conservative management. CTA head and neck was negative for any traumatic vascular injury or any proximal intracranial LVO. Started on Keppra  for seizure prevention for 1 week. Patient was threatening to leave so [facility] by ED provider for safety.  5/30: Vital stable, improving sodium, currently at 130, stable thrombocytopenia with platelet at 120.  Elevated ethanol levels at 286 and UDS positive for cannabinoid, magnesium, ammonia, salicylate and Tylenol  levels were normal.  CIWA score of 4.  Sitter at bedside Repeat CT head this morning seems stable.  5/31: Hemodynamically stable, intermittent agitation, still requiring sitter. Starting on phenobarbital taper and high-dose thiamine . Worsening hyponatremia with continuation of normal saline so it was discontinued-doing fluid restriction and adding salt tablet.  6/1: Hemodynamically stable, intermittent agitation and wants to get out of the hospital.  Refusing to work with physical therapy.  Sodium with further decreased to 126-increasing the dose of salt tablets and nephrology was also consulted.  Also concern of cerebral salt wasting with recent [facility].  6/2: Hemodynamically stable but remained intermittently agitated.  Sodium at 128 today after increasing the dose of salt tablets.  Awaiting nephrology recommendations for concern of cerebral salt wasting. Switching to p.o. phenobarbital. Continue to refuse working with physical therapy.  6/3: Hemodynamically stable, refusing  most of the care.  Sodium today at 127.  If continue to decrease then nephrology might do 3% saline. Psych was again contacted for reevaluation.  Refusing to work with PT and mother does not want him back without proper evaluation.  Getting high-dose thiamine  for concern of Warnicke with ataxia, differential remain between alcohol intoxication versus Warnicke.

## 2024-01-17 NOTE — Progress Notes (Signed)
 PT Cancellation Note  Patient Details Name: Mark Gordon MRN: [id number] DOB: 10/17/1980   Cancelled Treatment:    Reason Eval/Treat Not Completed: Patient declined, no reason specified (Patient refused despite encouragement. He has been up with nursing staff today. PT will continue with attempts.)  Ozie Bo, PT, MPT  Erlene Hawks 01/17/2024, 2:00 PM

## 2024-01-17 NOTE — Progress Notes (Signed)
 OT Cancellation Note  Patient Details Name: Mark Gordon MRN: [id number] DOB: 1980-09-26   Cancelled Treatment:    Reason Eval/Treat Not Completed: Patient declined, no reason specified. Consult received, chart reviewed. On arrival pt asleep, awakes to voice and refuses all mobility/ADL attempts stating "I want to sleep and all I am going to do right now is go back to sleep." Sitter in room endorses pt up to [facility] this date. Agreeable to re-attempt next date.   [name], M.S. OTR/L  01/17/24, 1:28 PM  ascom [phone]

## 2024-01-17 NOTE — Assessment & Plan Note (Addendum)
 Subarachnoid hemorrhage. Multiples subarachnoid and subdural hematomas with history of alcohol intoxication and frequent falls.  CTA of head and neck negative for any vascular injury.  Also shows stable nondisplaced right occipital bone fracture, there was also concern of questionable small fracture involving temporal bone. Neurosurgery is on board-think conservative management -Continue with Keppra for 7 days for seizure prophylaxis - Continue to monitor

## 2024-01-17 NOTE — Plan of Care (Signed)

## 2024-01-17 NOTE — Assessment & Plan Note (Signed)
 Resolved with repletion.  Magnesium was normal -Continue to monitor and replete as needed

## 2024-01-17 NOTE — Assessment & Plan Note (Signed)
-   Nicotine  patch as needed -Counseling was provided

## 2024-01-17 NOTE — Progress Notes (Signed)
 Neurosurgery Progress Note  History: Mark Gordon is here for a traumatic intracranial hemorrhage as well as alcohol withdrawal and hyponatremia.  HD2: No complaints this morning.  He had to have a sitter overnight.  Physical Exam: Vitals:   01/17/24 0353 01/17/24 0829  BP: 134/77 139/83  Pulse: 77 74  Resp:  20  Temp:  98 F (36.7 C)  SpO2:  92%    AA Ox3 CNI  Strength:5/5 throughout bilateral upper and lower extremities  Data:  Other tests/results:  CT Head 01/16/2024 IMPRESSION: 1. Increased right inferolateral cerebellar hemisphere edema superimposed on stable small hemorrhagic contusion and right cerebellar SAH, suspicious for an acute/evolving Ischemic Infarct such as due to distal right vertebral artery injury. Follow-up CTA Head and Neck recommended. 2. Bilateral SDH has mildly progressed since 0413 hours today, now 6 mm and remaining symmetric. 3. Small right inferior frontal gyrus hemorrhagic contusions now are apparent (10-15 mm). 4. Stable SAH. 5. No midline shift or significant intracranial mass effect. No IVH or ventriculomegaly. 6. Stable nondisplaced right occipital bone fracture.   Electronically Signed: By: Marlise Simpers M.D. On: 01/16/2024 11:13  Assessment/Plan:  Mark Gordon is here with a traumatic intracranial hemorrhage.  He has multiple contusions on his recent CT scan.  He is currently being managed medically for hyponatremia.  - mobilize - pain control - DVT prophylaxis - ok to start after stable CT - will order today - PTOT - Continue Keppra for a total of 7 days     Jodeen Munch MD, [facility] Department of Neurosurgery

## 2024-01-17 NOTE — Assessment & Plan Note (Addendum)
 Elevated ethanol levels.  Patient with increased insomnia and delirium secondary to alcohol intoxication.  Currently IVC and a sitter in place. -Continue with CIWA protocol - Counseling was provided

## 2024-01-17 NOTE — Assessment & Plan Note (Signed)
 Likely acute on chronic with history of alcohol abuse. Responded to IV fluid and current sodium is 130. - Giving some more normal saline due to poor p.o. intake at this time. - Monitor sodium

## 2024-01-18 LAB — RENAL FUNCTION PANEL
Albumin: 3.7 g/dL (ref 3.5–5.0)
Anion gap: 11 (ref 5–15)
BUN: 10 mg/dL (ref 6–20)
CO2: 21 mmol/L — ABNORMAL LOW (ref 22–32)
Calcium: 8.9 mg/dL (ref 8.9–10.3)
Chloride: 95 mmol/L — ABNORMAL LOW (ref 98–111)
Creatinine, Ser: 0.96 mg/dL (ref 0.61–1.24)
GFR, Estimated: >60 mL/min (ref >60)
Glucose, Bld: 93 mg/dL (ref 70–99)
Phosphorus: 3.2 mg/dL (ref 2.5–4.6)
Potassium: 4.1 mmol/L (ref 3.5–5.1)
Sodium: 127 mmol/L — ABNORMAL LOW (ref 135–145)

## 2024-01-18 MED ORDER — PHENOBARBITAL SODIUM 130 MG/ML IJ SOLN
130.0000 mg | Freq: Once | INTRAMUSCULAR | Status: AC
  Administered 2024-01-18: 130 mg via INTRAVENOUS
  Filled 2024-01-18 (×2): qty 1

## 2024-01-18 MED ORDER — THIAMINE HCL 100 MG/ML IJ SOLN
500.0000 mg | INTRAVENOUS | Status: DC
  Administered 2024-01-21: 500 mg via INTRAVENOUS
  Filled 2024-01-18 (×3): qty 5

## 2024-01-18 MED ORDER — THIAMINE HCL 100 MG/ML IJ SOLN
500.0000 mg | Freq: Three times a day (TID) | INTRAVENOUS | Status: AC
  Administered 2024-01-18 – 2024-01-20 (×8): 500 mg via INTRAVENOUS
  Filled 2024-01-18 (×9): qty 5

## 2024-01-18 MED ORDER — PHENOBARBITAL SODIUM 65 MG/ML IJ SOLN: 65.0000 mg | Freq: Three times a day (TID) | INTRAMUSCULAR | Status: DC

## 2024-01-18 MED ORDER — SODIUM CHLORIDE 1 G PO TABS
1.0000 g | ORAL_TABLET | Freq: Two times a day (BID) | ORAL | Status: DC
  Administered 2024-01-18 – 2024-01-19 (×2): 1 g via ORAL
  Filled 2024-01-18 (×2): qty 1

## 2024-01-18 MED ORDER — PHENOBARBITAL SODIUM 65 MG/ML IJ SOLN: 32.5000 mg | Freq: Three times a day (TID) | INTRAMUSCULAR | Status: DC

## 2024-01-18 MED ORDER — PHENOBARBITAL SODIUM 130 MG/ML IJ SOLN
97.5000 mg | Freq: Three times a day (TID) | INTRAMUSCULAR | Status: AC
  Administered 2024-01-18 – 2024-01-20 (×6): 97.5 mg via INTRAVENOUS
  Filled 2024-01-18 (×6): qty 1

## 2024-01-18 NOTE — Assessment & Plan Note (Signed)
 Elevated ethanol levels.  Patient with increased insomnia and delirium secondary to alcohol intoxication.  Currently IVC and a sitter in place.  Continue to have intermittent agitation -Starting on phenobarbital taper -High-dose thiamine  -Continue with CIWA protocol - Counseling was provided

## 2024-01-18 NOTE — Assessment & Plan Note (Signed)
 Resolved with repletion.  Magnesium was normal -Continue to monitor and replete as needed

## 2024-01-18 NOTE — Progress Notes (Signed)
 Patient's BP is 153/98, RN was going to recheck BP but patient is refusing to be check, pt is becoming irritated and agitated, wants to be left alone. Pt usually don't require labetalol , also pt HR is in 50's. Pt is asymptomatic, will attempt to do his VS again later this morning if pt allows RN. No other concern at the moment. Plan of care continued.

## 2024-01-18 NOTE — Assessment & Plan Note (Signed)
 Subarachnoid hemorrhage. Multiples subarachnoid and subdural hematomas with history of alcohol intoxication and frequent falls.  CTA of head and neck negative for any vascular injury.  Also shows stable nondisplaced right occipital bone fracture, there was also concern of questionable small fracture involving temporal bone. Neurosurgery is on board-think conservative management -Continue with Keppra for 7 days for seizure prophylaxis - Continue to monitor

## 2024-01-18 NOTE — Plan of Care (Signed)
   Problem: Clinical Measurements: Goal: Will remain free from infection Outcome: Progressing Goal: Diagnostic test results will improve Outcome: Progressing Goal: Respiratory complications will improve Outcome: Progressing Goal: Cardiovascular complication will be avoided Outcome: Progressing   Problem: Activity: Goal: Risk for activity intolerance will decrease Outcome: Progressing

## 2024-01-18 NOTE — Progress Notes (Signed)
 PT Cancellation Note  Patient Details Name: Mark Gordon MRN: [id number] DOB: 01/20/1981   Cancelled Treatment:    Reason Eval/Treat Not Completed: Patient declined to participate with PT evaluation despite encouragement.  Pt simply repeated "I'm just not doing that".  Will attempt to see pt at a future date/time as medically appropriate.     Lavenia Post PT, DPT 01/18/24, 1:49 PM

## 2024-01-18 NOTE — Progress Notes (Addendum)
 Neurosurgery Progress Note  History: Mark Gordon is here for a traumatic intracranial hemorrhage as well as alcohol withdrawal and hyponatremia.  HD3: Some confusion when awake, but able to walk to restroom and MAEW. HD2: No complaints this morning.  He had to have a sitter overnight.  Physical Exam: Vitals:   01/18/24 0948 01/18/24 1059  BP: (!) 168/97 (!) 142/83  Pulse: 64   Resp: 20   Temp: 98.3 F (36.8 C)   SpO2: 97%     AA Ox3 but with some confusion CNI  Strength:MAEW 5/5  Data:  Other tests/results:  CT Head 01/16/2024 IMPRESSION: 1. Increased right inferolateral cerebellar hemisphere edema superimposed on stable small hemorrhagic contusion and right cerebellar SAH, suspicious for an acute/evolving Ischemic Infarct such as due to distal right vertebral artery injury. Follow-up CTA Head and Neck recommended. 2. Bilateral SDH has mildly progressed since 0413 hours today, now 6 mm and remaining symmetric. 3. Small right inferior frontal gyrus hemorrhagic contusions now are apparent (10-15 mm). 4. Stable SAH. 5. No midline shift or significant intracranial mass effect. No IVH or ventriculomegaly. 6. Stable nondisplaced right occipital bone fracture.   Electronically Signed: By: Marlise Simpers M.D. On: 01/16/2024 11:13  Na 127 today  Assessment/Plan:  Mark Gordon is here with a traumatic intracranial hemorrhage.  He has multiple contusions on his recent CT scan.  He is currently being managed medically for hyponatremia.  - mobilize - pain control - DVT prophylaxis - ok to continue - PTOT - Continue Keppra  for a total of 7 days - monitor sodium - goal >135      Jodeen Munch MD, [facility] Department of Neurosurgery

## 2024-01-18 NOTE — Progress Notes (Signed)
 OT Cancellation Note  Patient Details Name: Mark Gordon MRN: [id number] DOB: [birth year]   Cancelled Treatment:    Reason Eval/Treat Not Completed: Patient declined, no reason specified. Chart reviewed. On arrival pt reminded he deferred evaluation last date to this date however continues to refuse any OOB/ADL activity. Will re-attempt next date (when pt told plan to return he stated "I wont be here.")  [name], M.S. OTR/L  01/18/24, 1:51 PM  ascom [phone]

## 2024-01-18 NOTE — Assessment & Plan Note (Signed)
 Likely acute on chronic with history of alcohol abuse.  Also has subarachnoid hemorrhage Slight decrease in sodium to 127 with normal saline today -Discontinue normal saline -Add salt tablet -Fluid restriction - Monitor sodium

## 2024-01-18 NOTE — Progress Notes (Addendum)
 Patient restless throughout the night, pulling off telemetry monitor. Reorient patient and tell him to keep the telemetry monitor on but still takes it off. Place telemetry on his back to try to keep it on him but still pretty restless and pulls it out. Gave him ativan  at 0240 but still restless. Pt also refused to takes his VS, and urinated on the floor. Safety precaution in place and sitter at bedside. Provider made aware. No other concern at the moment. Plan of care continued.

## 2024-01-18 NOTE — Progress Notes (Signed)
 Progress Note   Patient: Mark Gordon ZOX:[id number] DOB: 14-Oct-1980 DOA: 01/16/2024     2 DOS: the patient was seen and examined on 01/18/2024   Brief hospital course: Taken from H&P.  Yahya Boldman is a 43 y.o. male with medical history significant of alcohol abuse brought in by EMS for alcohol intoxication and frequent falls.   On presentation vital stable, labs pertinent for hyponatremia with sodium at 124, bicarb 17.CT head showed multiple SDH and SAH no midline shift or mass effect.   Neurosurgery was consulted and CT head was repeated 6 hours later which shows stable SAH and slight increase in SDH.  Neurosurgery recommending conservative management. CTA head and neck was negative for any traumatic vascular injury or any proximal intracranial LVO. Started on Keppra  for seizure prevention for 1 week. Patient was threatening to leave so [facility] by ED provider for safety.  5/30: Vital stable, improving sodium, currently at 130, stable thrombocytopenia with platelet at 120.  Elevated ethanol levels at 286 and UDS positive for cannabinoid, magnesium, ammonia, salicylate and Tylenol  levels were normal.  CIWA score of 4.  Sitter at bedside Repeat CT head this morning seems stable.  5/31: Hemodynamically stable, intermittent agitation, still requiring sitter. Starting on phenobarbital taper and high-dose thiamine . Worsening hyponatremia with continuation of normal saline so it was discontinued-doing fluid restriction and adding salt tablet.  Assessment and Plan: * SDH (subdural hematoma) (HCC) Subarachnoid hemorrhage. Multiples subarachnoid and subdural hematomas with history of alcohol intoxication and frequent falls.  CTA of head and neck negative for any vascular injury.  Also shows stable nondisplaced right occipital bone fracture, there was also concern of questionable small fracture involving temporal bone. Neurosurgery is on board-think conservative management -Continue with  Keppra  for 7 days for seizure prophylaxis - Continue to monitor  Alcohol intoxication (HCC) Elevated ethanol levels.  Patient with increased insomnia and delirium secondary to alcohol intoxication.  Currently IVC and a sitter in place.  Continue to have intermittent agitation -Starting on phenobarbital taper -High-dose thiamine  -Continue with CIWA protocol - Counseling was provided  Hyponatremia Likely acute on chronic with history of alcohol abuse.  Also has subarachnoid hemorrhage Slight decrease in sodium to 127 with normal saline today -Discontinue normal saline -Add salt tablet -Fluid restriction - Monitor sodium  Hypokalemia Resolved with repletion.  Magnesium was normal -Continue to monitor and replete as needed  Cigarette smoker - Nicotine  patch as needed -Counseling was provided  Obesity, Class II, BMI 35-39.9 Estimated body mass index is 38.18 kg/m as calculated from the following:   Height as of this encounter: 6\' 2"  (1.88 m).   Weight as of this encounter: 134.9 kg.   - Encouraged weight loss   Subjective: Patient was sleeping after getting Ativan  when seen today.  Significant agitation overnight.  Sitter at bedside.  Physical Exam: Vitals:   01/17/24 2320 01/18/24 0340 01/18/24 0948 01/18/24 1059  BP: (!) 153/98 (!) 147/97 (!) 168/97 (!) 142/83  Pulse: 76 (!) 54 64   Resp: 19  20   Temp: 98.2 F (36.8 C) 98.2 F (36.8 C) 98.3 F (36.8 C)   TempSrc:  Axillary Oral   SpO2: 98% 97% 97%   Weight:      Height:       General.  Obese gentleman, in no acute distress. Pulmonary.  Lungs clear bilaterally, normal respiratory effort. CV.  Regular rate and rhythm, no JVD, rub or murmur. Abdomen.  Soft, nontender, nondistended, BS positive. CNS.  Somnolent,  no apparent deficit Extremities.  No edema, no cyanosis, pulses intact and symmetrical.  Data Reviewed: Prior data reviewed  Family Communication: Talked with mother on phone.  Disposition: Status  is: Inpatient Remains inpatient appropriate because: Severity of illness  Planned Discharge Destination: Home   Time spent: 50 minutes  This record has been created using Conservation officer, historic buildings. Errors have been sought and corrected,but may not always be located. Such creation errors do not reflect on the standard of care.   Author: Luna Salinas, MD 01/18/2024 3:36 PM  For on call review [email or web address].

## 2024-01-18 NOTE — Plan of Care (Signed)
  Problem: Clinical Measurements: Goal: Ability to maintain clinical measurements within normal limits will improve Outcome: Progressing Goal: Will remain free from infection Outcome: Progressing Goal: Respiratory complications will improve Outcome: Progressing Goal: Cardiovascular complication will be avoided Outcome: Progressing   Problem: Coping: Goal: Level of anxiety will decrease Outcome: Not Progressing   Problem: Safety: Goal: Ability to remain free from injury will improve Outcome: Progressing

## 2024-01-19 LAB — BASIC METABOLIC PANEL WITH GFR
Anion gap: 10 (ref 5–15)
BUN: 14 mg/dL (ref 6–20)
CO2: 22 mmol/L (ref 22–32)
Calcium: 9.2 mg/dL (ref 8.9–10.3)
Chloride: 94 mmol/L — ABNORMAL LOW (ref 98–111)
Creatinine, Ser: 1.03 mg/dL (ref 0.61–1.24)
GFR, Estimated: >60 mL/min (ref >60)
Glucose, Bld: 94 mg/dL (ref 70–99)
Potassium: 4.2 mmol/L (ref 3.5–5.1)
Sodium: 126 mmol/L — ABNORMAL LOW (ref 135–145)

## 2024-01-19 LAB — CBC
HCT: 49.3 % (ref 39.0–52.0)
Hemoglobin: 17.7 g/dL — ABNORMAL HIGH (ref 13.0–17.0)
MCH: 34.8 pg — ABNORMAL HIGH (ref 26.0–34.0)
MCHC: 35.9 g/dL (ref 30.0–36.0)
MCV: 97 fL (ref 80.0–100.0)
Platelets: 145 10*3/uL — ABNORMAL LOW (ref 150–400)
RBC: 5.08 MIL/uL (ref 4.22–5.81)
RDW: 13 % (ref 11.5–15.5)
WBC: 7.2 10*3/uL (ref 4.0–10.5)
nRBC: 0 % (ref 0.0–0.2)

## 2024-01-19 MED ORDER — SODIUM CHLORIDE 1 G PO TABS
2.0000 g | ORAL_TABLET | Freq: Two times a day (BID) | ORAL | Status: DC
  Administered 2024-01-19 – 2024-01-23 (×8): 2 g via ORAL
  Filled 2024-01-19 (×8): qty 2

## 2024-01-19 NOTE — Progress Notes (Signed)
 Neurosurgery Progress Note  History: Mark Gordon is here for a traumatic intracranial hemorrhage as well as alcohol withdrawal and hyponatremia.  HD4: Continued confusion but walking and moving all limbs.  No agitation HD3: Some confusion when awake, but able to walk to restroom and MAEW. HD2: No complaints this morning.  He had to have a sitter overnight.  Physical Exam: Vitals:   01/19/24 0734 01/19/24 0826  BP: (!) 125/94   Pulse: 69   Resp: 20 19  Temp: 98 F (36.7 C)   SpO2: 95% 96%    AA Ox self, place, and month (with choice) but with some confusion CNI  Strength:MAEW 5/5  Data:  Other tests/results:  CT Head 01/16/2024 IMPRESSION: 1. Increased right inferolateral cerebellar hemisphere edema superimposed on stable small hemorrhagic contusion and right cerebellar SAH, suspicious for an acute/evolving Ischemic Infarct such as due to distal right vertebral artery injury. Follow-up CTA Head and Neck recommended. 2. Bilateral SDH has mildly progressed since 0413 hours today, now 6 mm and remaining symmetric. 3. Small right inferior frontal gyrus hemorrhagic contusions now are apparent (10-15 mm). 4. Stable SAH. 5. No midline shift or significant intracranial mass effect. No IVH or ventriculomegaly. 6. Stable nondisplaced right occipital bone fracture.   Electronically Signed: By: Marlise Simpers M.D. On: 01/16/2024 11:13  Na 126 today from 127  Assessment/Plan:  Mark Gordon is here with a traumatic intracranial hemorrhage.  He has multiple contusions on his recent CT scan.  He is currently being managed medically for hyponatremia.  - mobilize - pain control - DVT prophylaxis - ok to continue - PTOT - Continue Keppra  for a total of 7 days - monitor sodium - goal >135      Jodeen Munch MD, [facility] Department of Neurosurgery

## 2024-01-19 NOTE — Progress Notes (Signed)
 Progress Note   Patient: Mark Gordon XBJ:[id number] DOB: 10/27/1980 DOA: 01/16/2024     3 DOS: the patient was seen and examined on 01/19/2024   Brief hospital course: Taken from H&P.  Mark Gordon is a 43 y.o. male with medical history significant of alcohol abuse brought in by EMS for alcohol intoxication and frequent falls.   On presentation vital stable, labs pertinent for hyponatremia with sodium at 124, bicarb 17.CT head showed multiple SDH and SAH no midline shift or mass effect.   Neurosurgery was consulted and CT head was repeated 6 hours later which shows stable SAH and slight increase in SDH.  Neurosurgery recommending conservative management. CTA head and neck was negative for any traumatic vascular injury or any proximal intracranial LVO. Started on Keppra  for seizure prevention for 1 week. Patient was threatening to leave so [facility] by ED provider for safety.  5/30: Vital stable, improving sodium, currently at 130, stable thrombocytopenia with platelet at 120.  Elevated ethanol levels at 286 and UDS positive for cannabinoid, magnesium, ammonia, salicylate and Tylenol  levels were normal.  CIWA score of 4.  Sitter at bedside Repeat CT head this morning seems stable.  5/31: Hemodynamically stable, intermittent agitation, still requiring sitter. Starting on phenobarbital taper and high-dose thiamine . Worsening hyponatremia with continuation of normal saline so it was discontinued-doing fluid restriction and adding salt tablet.  6/1: Hemodynamically stable, intermittent agitation and wants to get out of the hospital.  Refusing to work with physical therapy.  Sodium with further decreased to 126-increasing the dose of salt tablets and nephrology was also consulted.  Also concern of cerebral salt wasting with recent SAH.  Assessment and Plan: * SDH (subdural hematoma) (HCC) Subarachnoid hemorrhage. Multiples subarachnoid and subdural hematomas with history of alcohol  intoxication and frequent falls.  CTA of head and neck negative for any vascular injury.  Also shows stable nondisplaced right occipital bone fracture, there was also concern of questionable small fracture involving temporal bone. Neurosurgery is on board-think conservative management -Continue with Keppra  for 7 days for seizure prophylaxis - Continue to monitor  Alcohol intoxication (HCC) Elevated ethanol levels.  Patient with increased insomnia and delirium secondary to alcohol intoxication.  Currently IVC and a sitter in place.  Continue to have intermittent agitation - Continue with phenobarbital taper - Continue with high-dose thiamine  -Continue with CIWA protocol - Counseling was provided  Hyponatremia Likely acute on chronic with history of alcohol abuse.  Also has subarachnoid hemorrhage that can cause cerebral salt wasting Slowly decreasing sodium now at 126, normal saline was discontinued yesterday -Nephrology was consulted -Increasing the dose of salt tablets to 2 g - Continue with fluid restriction - Monitor sodium  Hypokalemia Resolved with repletion.  Magnesium was normal -Continue to monitor and replete as needed  Cigarette smoker - Nicotine  patch as needed -Counseling was provided  Obesity, Class II, BMI 35-39.9 Estimated body mass index is 38.18 kg/m as calculated from the following:   Height as of this encounter: 6\' 2"  (1.88 m).   Weight as of this encounter: 134.9 kg.   - Encouraged weight loss   Subjective: Patient was more awake and alert when seen today.  Becoming intermittently agitated stating that he wants to go home  Physical Exam: Vitals:   01/19/24 0525 01/19/24 0734 01/19/24 0826 01/19/24 1159  BP: (!) 127/95 (!) 125/94  (!) 133/92  Pulse: (!) 59 69  75  Resp: 18 20 19  (!) 21  Temp:  98 F (36.7 C)  (!)  97.5 F (36.4 C)  TempSrc:  Axillary  Axillary  SpO2: 97% 95% 96% 98%  Weight:      Height:       General.  Obese gentleman, in no  acute distress. Pulmonary.  Lungs clear bilaterally, normal respiratory effort. CV.  Regular rate and rhythm, no JVD, rub or murmur. Abdomen.  Soft, nontender, nondistended, BS positive. CNS.  Alert and oriented .  No focal neurologic deficit. Extremities.  No edema, no cyanosis, pulses intact and symmetrical.   Data Reviewed: Prior data reviewed  Family Communication: Talked with mother on phone.  Disposition: Status is: Inpatient Remains inpatient appropriate because: Severity of illness  Planned Discharge Destination: Home   Time spent: 50 minutes  This record has been created using Conservation officer, historic buildings. Errors have been sought and corrected,but may not always be located. Such creation errors do not reflect on the standard of care.   Author: Luna Salinas, MD 01/19/2024 2:36 PM  For on call review [email or web address].

## 2024-01-19 NOTE — Progress Notes (Signed)
 OT Cancellation Note  Patient Details Name: Mark Gordon MRN: [id number] DOB: 25-Jul-1981   Cancelled Treatment:    Reason Eval/Treat Not Completed: Patient declined, no reason specified.Per conversation with PT pt is refusing on this date to work with therapy. Will re-attempt tomorrow after multiple refusals.   Rosaria Common M.S. OTR/L  01/19/24, 1:17 PM

## 2024-01-19 NOTE — Progress Notes (Signed)
 PT Cancellation Note  Patient Details Name: Mark Gordon MRN: [id number] DOB: [birth year]   Cancelled Treatment:    Reason Eval/Treat Not Completed: Patient declined, no reason specified, simply states "I'm not doing that".  This refusal was the pt's third day of consecutive refusals to participate with PT services.  Will complete PT orders at this time but will reassess pt pending a change in pt's willingness to participate upon receipt of new PT orders.   Lavenia Post PT, DPT 01/19/24, 1:18 PM

## 2024-01-19 NOTE — Plan of Care (Signed)
 Patient still confused, not able to understand plan of care

## 2024-01-20 LAB — BASIC METABOLIC PANEL WITH GFR
Anion gap: 11 (ref 5–15)
BUN: 18 mg/dL (ref 6–20)
CO2: 21 mmol/L — ABNORMAL LOW (ref 22–32)
Calcium: 9.3 mg/dL (ref 8.9–10.3)
Chloride: 96 mmol/L — ABNORMAL LOW (ref 98–111)
Creatinine, Ser: 1.03 mg/dL (ref 0.61–1.24)
GFR, Estimated: >60 mL/min (ref >60)
Glucose, Bld: 88 mg/dL (ref 70–99)
Potassium: 4.3 mmol/L (ref 3.5–5.1)
Sodium: 128 mmol/L — ABNORMAL LOW (ref 135–145)

## 2024-01-20 MED ORDER — PHENOBARBITAL 32.4 MG PO TABS
64.8000 mg | ORAL_TABLET | Freq: Three times a day (TID) | ORAL | Status: AC
  Administered 2024-01-20 – 2024-01-22 (×6): 64.8 mg via ORAL
  Filled 2024-01-20 (×6): qty 2

## 2024-01-20 MED ORDER — PHENOBARBITAL 32.4 MG PO TABS
32.4000 mg | ORAL_TABLET | Freq: Three times a day (TID) | ORAL | Status: DC
  Administered 2024-01-22 – 2024-01-23 (×3): 32.4 mg via ORAL
  Filled 2024-01-20 (×3): qty 1

## 2024-01-20 MED ORDER — ENSURE PLUS HIGH PROTEIN PO LIQD: 237.0000 mL | Freq: Two times a day (BID) | ORAL | Status: DC

## 2024-01-20 NOTE — TOC Progression Note (Signed)
 Transition of Care [facility]) - Progression Note    Patient Details  Name: Mark Gordon MRN: [id number] Date of Birth: June 02, 1981  Transition of Care [facility]) CM/SW Contact  Odilia Bennett, LCSW Phone Number: 01/20/2024, 12:51 PM  Clinical Narrative:   Per RN assessments, patient is not fully oriented. Patient does not have insurance or a PCP. CSW placed PCP packet and intake paperwork for the [facility] on his chart.   Expected Discharge Plan:  (TBD) Barriers to Discharge: Continued Medical Work up  Expected Discharge Plan and Services                                               Social Determinants of Health (SDOH) Interventions SDOH Screenings   Tobacco Use: High Risk (01/16/2024)    Readmission Risk Interventions     No data to display

## 2024-01-20 NOTE — Progress Notes (Signed)
 Progress Note   Patient: Mark Gordon ZOX:[id number] DOB: 12-Nov-1980 DOA: 01/16/2024     4 DOS: the patient was seen and examined on 01/20/2024   Brief hospital course: Taken from H&P.  Mark Gordon is a 43 y.o. male with medical history significant of alcohol abuse brought in by EMS for alcohol intoxication and frequent falls.   On presentation vital stable, labs pertinent for hyponatremia with sodium at 124, bicarb 17.CT head showed multiple SDH and SAH no midline shift or mass effect.   Neurosurgery was consulted and CT head was repeated 6 hours later which shows stable SAH and slight increase in SDH.  Neurosurgery recommending conservative management. CTA head and neck was negative for any traumatic vascular injury or any proximal intracranial LVO. Started on Keppra  for seizure prevention for 1 week. Patient was threatening to leave so [facility] [place] by ED provider for safety.  5/30: Vital stable, improving sodium, currently at 130, stable thrombocytopenia with platelet at 120.  Elevated ethanol levels at 286 and UDS positive for cannabinoid, magnesium, ammonia, salicylate and Tylenol  levels were normal.  CIWA score of 4.  Sitter at bedside Repeat CT head this morning seems stable.  5/31: Hemodynamically stable, intermittent agitation, still requiring sitter. Starting on phenobarbital taper and high-dose thiamine . Worsening hyponatremia with continuation of normal saline so it was discontinued-doing fluid restriction and adding salt tablet.  6/1: Hemodynamically stable, intermittent agitation and wants to get out of the hospital.  Refusing to work with physical therapy.  Sodium with further decreased to 126-increasing the dose of salt tablets and nephrology was also consulted.  Also concern of cerebral salt wasting with recent [facility].  6/2: Hemodynamically stable but remained intermittently agitated.  Sodium at 128 today after increasing the dose of salt tablets.  Awaiting nephrology  recommendations for concern of cerebral salt wasting. Switching to p.o. phenobarbital. Continue to refuse working with physical therapy.  Assessment and Plan: * SDH (subdural hematoma) (HCC) Subarachnoid hemorrhage. Multiples subarachnoid and subdural hematomas with history of alcohol intoxication and frequent falls.  CTA of head and neck negative for any vascular injury.  Also shows stable nondisplaced right occipital bone fracture, there was also concern of questionable small fracture involving temporal bone. Neurosurgery is on board-think conservative management -Continue with Keppra  for 7 days for seizure prophylaxis - Continue to monitor  Alcohol intoxication (HCC) Elevated ethanol levels.  Patient with increased insomnia and delirium secondary to alcohol intoxication.  Currently IVC and a sitter in place.  Continue to have intermittent agitation - Continue with phenobarbital taper-switching to p.o. - Continue with high-dose thiamine  -Continue with CIWA protocol - Counseling was provided  Hyponatremia Likely acute on chronic with history of alcohol abuse.  Also has subarachnoid hemorrhage that can cause cerebral salt wasting Some improvement in sodium to 128 today after increasing the dose of salt tablets yesterday -Nephrology was consulted -Continue with salt tablets at 2 g twice daily - Monitor sodium  Hypokalemia Resolved with repletion.  Magnesium was normal -Continue to monitor and replete as needed  Cigarette smoker - Nicotine  patch as needed -Counseling was provided  Obesity, Class II, BMI 35-39.9 Estimated body mass index is 38.18 kg/m as calculated from the following:   Height as of this encounter: 6\' 2"  (1.88 m).   Weight as of this encounter: 134.9 kg.   - Encouraged weight loss   Subjective: Patient was lying comfortably when seen today.  Becoming little irritable when asking questions.  Wants to get out of the hospital.  Appetite poor.  Physical  Exam: Vitals:   01/19/24 2351 01/20/24 0310 01/20/24 0350 01/20/24 1151  BP:  (!) 139/113 (!) 140/72 (!) (P) 122/94  Pulse: 63 (!) 54 (!) 55 (P) 68  Resp: 15 15  (P) 16  Temp: 97.6 F (36.4 C) (!) 97.5 F (36.4 C)    TempSrc:      SpO2: 96% 97% 99% (P) 97%  Weight:      Height:       General.  Obese gentleman, in no acute distress. Pulmonary.  Lungs clear bilaterally, normal respiratory effort. CV.  Regular rate and rhythm, no JVD, rub or murmur. Abdomen.  Soft, nontender, nondistended, BS positive. CNS.  Alert and oriented .  No focal neurologic deficit. Extremities.  No edema, no cyanosis, pulses intact and symmetrical.  Data Reviewed: Prior data reviewed  Family Communication: Talked with mother on phone.  Disposition: Status is: Inpatient Remains inpatient appropriate because: Severity of illness  Planned Discharge Destination: Home    Time spent: 45 minutes  This record has been created using Conservation officer, historic buildings. Errors have been sought and corrected,but may not always be located. Such creation errors do not reflect on the standard of care.   Author: Luna Salinas, MD 01/20/2024 3:00 PM  For on call review [email or web address].

## 2024-01-20 NOTE — Consult Note (Signed)
 [facility] Consult Note: 01/20/24     Date of Admission:  01/16/2024           Reason for Consult:  Hyponatremia    Referring Provider: Luna Salinas, MD Primary Care Provider: Patient, No Pcp Per   History of Presenting Illness:  Mark Gordon is a 43 y.o. male with medical problems of alcohol abuse brought in for alcohol intoxication and frequent falls.  Workup in the emergency room showed multiple subdural hemorrhages and subarachnoid hemorrhage.  Evaluated by neurosurgery-conservative management.  Started on Keppra  for seizure prevention.  Required sitter for intermittent agitation.  Nephrology consult has been requested for evaluation of hyponatremia.  Usual sodium level appears to be between 130-132 at the time of admission.  No other outpatient results are available for comparison. Yesterday sodium dropped to 126.  This morning it is up to 128.   Resting quietly when seen.  Did not eat anything for breakfast.  Sitter in the room states that he is able to get up and go to the bathroom.  He has not had his lunch.  He is quite irritable and answers only a few yes/no questions.  States that he will eat when he wants to.  There is no leg edema.  Review of Systems: ROS - not reliable a patient is quite irrtable.   History reviewed. No pertinent past medical history.  Social History   Tobacco Use   Smoking status: Every Day    Current packs/day: 0.25    Types: Cigarettes   Smokeless tobacco: Never  Vaping Use   Vaping status: Never Used  Substance Use Topics   Alcohol use: Yes    Alcohol/week: 24.0 standard drinks of alcohol    Types: 24 Cans of beer per week   Drug use: Yes    Types: Marijuana    History reviewed. No pertinent family history.   OBJECTIVE: Blood pressure (!) (P) 122/94, pulse (P) 68, temperature (!) 97.5 F (36.4 C), resp. rate (P) 16, height 6\' 2"  (1.88 m), weight 134.9 kg, SpO2 (P) 97%.  Physical Exam General  Appearance-lying in the bed, no acute distress HEENT-eyes closed Pulmonary-normal breathing effort on room air, clear to auscultation Cardiac-regular rhythm Abdomen-soft Extremities-no peripheral edema Neuro-resting quietly, answers few yes/no questions.   Lab Results Lab Results  Component Value Date   WBC 7.2 01/19/2024   HGB 17.7 (H) 01/19/2024   HCT 49.3 01/19/2024   MCV 97.0 01/19/2024   PLT 145 (L) 01/19/2024    Lab Results  Component Value Date   CREATININE 1.03 01/20/2024   BUN 18 01/20/2024   NA 128 (L) 01/20/2024   K 4.3 01/20/2024   CL 96 (L) 01/20/2024   CO2 21 (L) 01/20/2024    Lab Results  Component Value Date   ALT 34 01/16/2024   AST 35 01/16/2024   ALKPHOS 86 01/16/2024   BILITOT 1.1 01/16/2024     Microbiology: No results found for this or any previous visit (from the past 240 hours).  Medications: Scheduled Meds:  folic acid   1 mg Oral Daily   levETIRAcetam   500 mg Oral BID   multivitamin with minerals  1 tablet Oral Daily   phenobarbital  64.8 mg Oral TID   Followed by   Cecily Cohen ON 01/22/2024] phenobarbital  32.4 mg Oral TID   sodium chloride   2 g Oral BID WC   Continuous Infusions:  thiamine  (VITAMIN B1) injection 500 mg (01/20/24 1610)   Followed by   [  START ON 01/21/2024] thiamine  (VITAMIN B1) injection     PRN Meds:.acetaminophen , labetalol , ondansetron  (ZOFRAN ) IV  No Known Allergies  Urinalysis: No results for input(s): "COLORURINE", "LABSPEC", "PHURINE", "GLUCOSEU", "HGBUR", "BILIRUBINUR", "KETONESUR", "PROTEINUR", "UROBILINOGEN", "NITRITE", "LEUKOCYTESUR" in the last 72 hours.  Invalid input(s): "APPERANCEUR"    Imaging: No results found.    Assessment/Plan:  Mark Gordon is a 43 y.o. male with medical problems of alcohol abuse was admitted on 01/16/2024 for :  Hyponatremia [E87.1] SAH (subarachnoid hemorrhage) (HCC) [I60.9] SDH (subdural hematoma) (HCC) [S06.5XAA] Skull fracture with cerebral contusion, closed,  initial encounter (HCC) [S02.91XA, S06.33AA] Alcoholic intoxication with complication (HCC) [F10.929]  Hyponatremia Differential includes chronic hyponatremia due to alcohol abuse, with worsening of Na related to SIADH versus cerebral salt wasting. CSW is characterized by extracellular volume depletion such as hypotension and decreased skin turgor.  At present, Na levels are slowly improving but if sodium level drops further, would start low-dose 3% (hypertonic) saline at 30 cc/h with a goal of correction not more than 8 mEq in 24 hours. For now continue salt tablets as prescribed. Currently, oral intake is poor. Provide nutritional supplements in the form of Ensure/boost. Monitor closely.    Calyse Murcia Zelda Hickman 01/20/24

## 2024-01-20 NOTE — Progress Notes (Signed)
 OT Cancellation Note  Patient Details Name: Ryan Ogborn MRN: [id number] DOB: 03-23-1981   Cancelled Treatment:    Reason Eval/Treat Not Completed: Patient declined, no reason specified. Pt with 4th refusal stating "I'm not going to do that." OT signed off in house, can re consult if pt has a change in status.  Shanicka Oldenkamp E Latrish Mogel 01/20/2024, 2:13 PM

## 2024-01-21 LAB — BASIC METABOLIC PANEL WITH GFR
Anion gap: 12 (ref 5–15)
BUN: 20 mg/dL (ref 6–20)
CO2: 19 mmol/L — ABNORMAL LOW (ref 22–32)
Calcium: 9.2 mg/dL (ref 8.9–10.3)
Chloride: 96 mmol/L — ABNORMAL LOW (ref 98–111)
Creatinine, Ser: 1.12 mg/dL (ref 0.61–1.24)
GFR, Estimated: >60 mL/min (ref >60)
Glucose, Bld: 85 mg/dL (ref 70–99)
Potassium: 4.3 mmol/L (ref 3.5–5.1)
Sodium: 127 mmol/L — ABNORMAL LOW (ref 135–145)

## 2024-01-21 MED ORDER — GABAPENTIN 300 MG PO CAPS
300.0000 mg | ORAL_CAPSULE | Freq: Two times a day (BID) | ORAL | Status: DC
  Administered 2024-01-22 – 2024-01-23 (×3): 300 mg via ORAL
  Filled 2024-01-21 (×3): qty 1

## 2024-01-21 NOTE — Progress Notes (Signed)
 Neurosurgery Progress Note  History: Mark Gordon is here for a traumatic intracranial hemorrhage as well as alcohol withdrawal and hyponatremia.  HD6: Still poor PO intake. Mild HA. HD4: Continued confusion but walking and moving all limbs.  No agitation HD3: Some confusion when awake, but able to walk to restroom and MAEW. HD2: No complaints this morning.  He had to have a sitter overnight.  Physical Exam: Vitals:   01/20/24 2358 01/21/24 0345  BP: 134/83 120/76  Pulse: 65 70  Resp: 18 18  Temp: 98.4 F (36.9 C) 98.2 F (36.8 C)  SpO2: 98% 96%    AA Ox self, place, and date without choices CNI  Strength:MAEW 5/5  Data:  Other tests/results:  CT Head 01/16/2024 IMPRESSION: 1. Increased right inferolateral cerebellar hemisphere edema superimposed on stable small hemorrhagic contusion and right cerebellar SAH, suspicious for an acute/evolving Ischemic Infarct such as due to distal right vertebral artery injury. Follow-up CTA Head and Neck recommended. 2. Bilateral SDH has mildly progressed since 0413 hours today, now 6 mm and remaining symmetric. 3. Small right inferior frontal gyrus hemorrhagic contusions now are apparent (10-15 mm). 4. Stable SAH. 5. No midline shift or significant intracranial mass effect. No IVH or ventriculomegaly. 6. Stable nondisplaced right occipital bone fracture.   Electronically Signed: By: Mark Gordon M.D. On: 01/16/2024 11:13  Na 128 yesterday - pending today  Assessment/Plan:  Mark Gordon is here with a traumatic intracranial hemorrhage.  He has multiple contusions on his recent CT scan.  He is currently being managed medically for hyponatremia.  - mobilize - pain control - DVT prophylaxis - ok to continue - PTOT - Continue Keppra  for a total of 7 days - monitor sodium - goal >135      Mark Munch MD, [facility] Department of Neurosurgery

## 2024-01-21 NOTE — Progress Notes (Signed)
 Progress Note   Patient: Mark Gordon ZOX:[id number] DOB: 06-Apr-1981 DOA: 01/16/2024     5 DOS: the patient was seen and examined on 01/21/2024   Brief hospital course: Taken from H&P.  Sasuke Yaffe is a 43 y.o. male with medical history significant of alcohol abuse brought in by EMS for alcohol intoxication and frequent falls.   On presentation vital stable, labs pertinent for hyponatremia with sodium at 124, bicarb 17.CT head showed multiple SDH and SAH no midline shift or mass effect.   Neurosurgery was consulted and CT head was repeated 6 hours later which shows stable SAH and slight increase in SDH.  Neurosurgery recommending conservative management. CTA head and neck was negative for any traumatic vascular injury or any proximal intracranial LVO. Started on Keppra  for seizure prevention for 1 week. Patient was threatening to leave so [facility] by ED provider for safety.  5/30: Vital stable, improving sodium, currently at 130, stable thrombocytopenia with platelet at 120.  Elevated ethanol levels at 286 and UDS positive for cannabinoid, magnesium, ammonia, salicylate and Tylenol  levels were normal.  CIWA score of 4.  Sitter at bedside Repeat CT head this morning seems stable.  5/31: Hemodynamically stable, intermittent agitation, still requiring sitter. Starting on phenobarbital taper and high-dose thiamine . Worsening hyponatremia with continuation of normal saline so it was discontinued-doing fluid restriction and adding salt tablet.  6/1: Hemodynamically stable, intermittent agitation and wants to get out of the hospital.  Refusing to work with physical therapy.  Sodium with further decreased to 126-increasing the dose of salt tablets and nephrology was also consulted.  Also concern of cerebral salt wasting with recent [facility].  6/2: Hemodynamically stable but remained intermittently agitated.  Sodium at 128 today after increasing the dose of salt tablets.  Awaiting nephrology  recommendations for concern of cerebral salt wasting. Switching to p.o. phenobarbital. Continue to refuse working with physical therapy.  6/3: Hemodynamically stable, refusing most of the care.  Sodium today at 127.  If continue to decrease then nephrology might do 3% saline. Psych was again contacted for reevaluation.  Refusing to work with PT and mother does not want him back without proper evaluation.  Getting high-dose thiamine  for concern of Warnicke with ataxia, differential remain between alcohol intoxication versus Warnicke.  Assessment and Plan: * SDH (subdural hematoma) (HCC) Subarachnoid hemorrhage. Multiples subarachnoid and subdural hematomas with history of alcohol intoxication and frequent falls.  CTA of head and neck negative for any vascular injury.  Also shows stable nondisplaced right occipital bone fracture, there was also concern of questionable small fracture involving temporal bone. Neurosurgery is on board-think conservative management -Continue with Keppra  for 7 days for seizure prophylaxis - Continue to monitor  Alcohol intoxication (HCC) Elevated ethanol levels.  Patient with increased insomnia and delirium secondary to alcohol intoxication.  Currently IVC and a sitter in place.  Continue to have intermittent agitation - Continue with phenobarbital taper-switched to p.o. - Continue with high-dose thiamine -concern of Warnicke as that cannot be completely ruled out at this time. -Continue with CIWA protocol - Counseling was provided  Hyponatremia Likely acute on chronic with history of alcohol abuse.  Also has subarachnoid hemorrhage that can cause cerebral salt wasting Seems fluctuating between 126-128. -Nephrology was consulted -Continue with salt tablets at 2 g twice daily -If continue to decrease then you might need hypertonic saline - Monitor sodium  Hypokalemia Resolved with repletion.  Magnesium was normal -Continue to monitor and replete as  needed  Cigarette smoker -  Nicotine  patch as needed -Counseling was provided  Obesity, Class II, BMI 35-39.9 Estimated body mass index is 38.18 kg/m as calculated from the following:   Height as of this encounter: 6\' 2"  (1.88 m).   Weight as of this encounter: 134.9 kg.   - Encouraged weight loss   Subjective: Patient was sleeping when seen today and refusing to talk when trying to wake him up.  P.o. intake remained poor.  Able to walk to the bathroom without any difficulty per sitter.  Physical Exam: Vitals:   01/20/24 1936 01/20/24 2358 01/21/24 0345 01/21/24 0839  BP: 138/83 134/83 120/76 121/68  Pulse: 71 65 70 82  Resp: 18 18 18 18   Temp: 97.8 F (36.6 C) 98.4 F (36.9 C) 98.2 F (36.8 C) 98.2 F (36.8 C)  TempSrc:    Oral  SpO2: 98% 98% 96% 94%  Weight:      Height:       General.  Obese gentleman, in no acute distress. Pulmonary.  Lungs clear bilaterally, normal respiratory effort. CV.  Regular rate and rhythm, no JVD, rub or murmur. Abdomen.  Soft, nontender, nondistended, BS positive. CNS.  Somnolent, refusing to talk.  No apparent deficit Extremities.  No edema, no cyanosis, pulses intact and symmetrical.  Data Reviewed: Prior data reviewed  Family Communication: Talked with mother on phone.  Disposition: Status is: Inpatient Remains inpatient appropriate because: Severity of illness  Planned Discharge Destination: Home    Time spent: 45 minutes  This record has been created using Conservation officer, historic buildings. Errors have been sought and corrected,but may not always be located. Such creation errors do not reflect on the standard of care.   Author: Luna Salinas, MD 01/21/2024 4:07 PM  For on call review [email or web address].

## 2024-01-21 NOTE — Plan of Care (Signed)

## 2024-01-21 NOTE — Progress Notes (Signed)
 [facility]  ROUNDING NOTE   Subjective:   Patient seen laying in bed Somnolent Sitter at bedside States patient has refused all lab draws Reports irritability when awakened  Objective:  Vital signs in last 24 hours:  Temp:  [97.8 F (36.6 C)-98.4 F (36.9 C)] 98.2 F (36.8 C) (06/03 0839) Pulse Rate:  [65-82] 82 (06/03 0839) Resp:  [18] 18 (06/03 0839) BP: (120-138)/(68-83) 121/68 (06/03 0839) SpO2:  [94 %-98 %] 94 % (06/03 0839)  Weight change:  Filed Weights   01/16/24 0351  Weight: 134.9 kg    Intake/Output: I/O last 3 completed shifts: In: 415 [P.O.:305; IV Piggyback:110] Out: -    Intake/Output this shift:  No intake/output data recorded.  Physical Exam: General: NAD  Head: Normocephalic, atraumatic.   Eyes: Anicteric  Lungs:  Clear to auscultation  Heart: Regular rate and rhythm  Abdomen:  Soft, nontender  Extremities:  No peripheral edema.  Neurologic: Awake, alert, conversant  Skin: Warm,dry, no rash       Basic Metabolic Panel: Recent Labs  Lab 01/16/24 0401 01/16/24 1131 01/16/24 2113 01/17/24 0536 01/18/24 0855 01/19/24 0911 01/20/24 1149  NA 124*   < > 131* 130* 127* 126* 128*  K 3.2*  --   --  4.1 4.1 4.2 4.3  CL 93*  --   --  96* 95* 94* 96*  CO2 17*  --   --  24 21* 22 21*  GLUCOSE 115*  --   --  96 93 94 88  BUN 7  --   --  8 10 14 18   CREATININE 0.96  --   --  1.07 0.96 1.03 1.03  CALCIUM 8.3*  --   --  9.4 8.9 9.2 9.3  MG 2.2  --   --   --   --   --   --   PHOS  --   --   --   --  3.2  --   --    < > = values in this interval not displayed.    Liver Function Tests: Recent Labs  Lab 01/16/24 0401 01/18/24 0855  AST 35  --   ALT 34  --   ALKPHOS 86  --   BILITOT 1.1  --   PROT 6.8  --   ALBUMIN 3.7 3.7   No results for input(s): "LIPASE", "AMYLASE" in the last 168 hours. Recent Labs  Lab 01/16/24 0427  AMMONIA 20    CBC: Recent Labs  Lab 01/16/24 0401 01/17/24 0536 01/19/24 0911  WBC 4.8  6.9 7.2  NEUTROABS 3.5  --   --   HGB 16.4 16.6 17.7*  HCT 45.8 45.7 49.3  MCV 97.7 98.5 97.0  PLT 121* 120* 145*    Cardiac Enzymes: Recent Labs  Lab 01/16/24 0401  CKTOTAL 178    BNP: Invalid input(s): "POCBNP"  CBG: Recent Labs  Lab 01/16/24 0436  GLUCAP 127*    Microbiology: No results found for this or any previous visit.  Coagulation Studies: No results for input(s): "LABPROT", "INR" in the last 72 hours.  Urinalysis: No results for input(s): "COLORURINE", "LABSPEC", "PHURINE", "GLUCOSEU", "HGBUR", "BILIRUBINUR", "KETONESUR", "PROTEINUR", "UROBILINOGEN", "NITRITE", "LEUKOCYTESUR" in the last 72 hours.  Invalid input(s): "APPERANCEUR"    Imaging: No results found.   Medications:    thiamine  (VITAMIN B1) injection 500 mg (01/21/24 0907)    feeding supplement  237 mL Oral BID BM   folic acid   1 mg Oral Daily  levETIRAcetam   500 mg Oral BID   multivitamin with minerals  1 tablet Oral Daily   phenobarbital  64.8 mg Oral TID   Followed by   Cecily Cohen ON 01/22/2024] phenobarbital  32.4 mg Oral TID   sodium chloride   2 g Oral BID WC   acetaminophen , labetalol , ondansetron  (ZOFRAN ) IV  Assessment/ Plan:  [name] is a 43 y.o.  male with medical problems of alcohol abuse was admitted on 01/16/2024 for Hyponatremia [E87.1] SAH (subarachnoid hemorrhage) (HCC) [I60.9] SDH (subdural hematoma) (HCC) [S06.5XAA] Skull fracture with cerebral contusion, closed, initial encounter (HCC) [S02.91XA, S06.33AA] Alcoholic intoxication with complication (HCC) [F10.929]   Hyponatremia Differential includes chronic hyponatremia due to alcohol abuse, with worsening of Na related to SIADH versus cerebral salt wasting. CSW is characterized by extracellular volume depletion such as hypotension and decreased skin turgor.   Patient has refused all labs draws this morning, no updated sodium levels. Will continue current treatment with salt tabs and nutritional  supplements.   LOS: 5 Wendy Hoback 6/3/202512:11 PM

## 2024-01-21 NOTE — Consult Note (Addendum)
 [facility] Psychiatric Consult Follow up  Patient Name: .Mark Gordon  MRN: [id number]  DOB: 03-23-1981  Consult Order details:  Orders (From admission, onward)     Start     Ordered   01/16/24 1126  IP CONSULT TO PSYCHIATRY       Ordering Provider: Frank [place], MD  Provider:  (Not yet assigned)  Question Answer Comment  Location [facility]   Reason for Consult? 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 male admitted: Medicallyfor 01/16/2024  3:43 AM  following a fall at home in the context of alcohol intoxication. He carries the psychiatric diagnoses of Alcohol abuse and has no documented  past medical history.  Patient has been evaluated and treated for subdural hematoma by neurosurgery.  Patient is noted to be irritable during the hospital stay but has allowed the treatment to go on.  Patient was started on Keppra  as a precaution for seizures.  Patient is completing the phenobarbital taper for alcohol detox.  On today's assessment patient is calm minimally engaging.  He provides superficial responses but is able to acknowledge that he is in the hospital and the neurosurgery is working on him.  He was unable to give details about the medical problems and the treatment he received.  But he did inform the provider that he is willing to complete the treatment and what ever is needed so that he can go home tomorrow.  Mom has no concerns.  Primary team later on informed the provider that patient has completed his treatment and has no acute medical issues.  At this point we will reevaluate the patient tomorrow to see if he is  consistent with his responses.  Will maintain the IVC for tonight but if there is no medical emergency or treatment indicated at this time there is no need for involuntary commitment as patient's family is willing to take him home and patient is willing to follow-up outpatient.    Diagnoses:  Active Hospital problems: Principal Problem:   SDH (subdural hematoma) (HCC) Active Problems:   Alcohol intoxication (HCC)   SAH (subarachnoid hemorrhage) (HCC)   Hyponatremia   Hypokalemia   Cigarette smoker   Obesity, Class II, BMI 35-39.9    Plan   ## Psychiatric Medication Recommendations:  Added gabapentin 300 gm bID to help with chronic alcoholism, anxiety/mood lability. Most of the antipsychotics/mood stabilizers cause hyponatremia so recommending conservative treatment for mood at this time. Continue current treatment. Continue to monitor  ## Medical Decision Making Capacity: Patient is able to acknowledge that he is in the hospital and acknowledge that neurosurgery has been helping him but remains minimally engaged in giving the details about his treatment.  He is oriented to self, situation.  Given the information obtained by the primary team that there is no medical emergency and there is no life-saving treatment pending at this time and they also informed that patient is in discharge phase, psychiatry finds no reason to maintain IVC at this point.  Will reevaluate the patient tomorrow to see if patient is  consistent with his responses of agreeing to engage in what ever treatment that is needed for him to be able to get discharged home.  ## Further Work-up:  -No other workup indicated at this time   ## Disposition:-- Plan Post Discharge/Psychiatric Care Follow-up resources for Substance abuse outpatient services/rehab programs  ## Behavioral / Environmental: - No specific recommendations at this time.     ## Safety and Observation Level:  - Based on my clinical evaluation, I  estimate the patient to be at low risk of self harm in the current setting. - At this time, we recommend  routine. This decision is based on my review of the chart including patient's history and current presentation, interview of the patient, mental status examination, and consideration of suicide risk including evaluating suicidal ideation, plan, intent, suicidal or self-harm behaviors, risk factors, and protective factors. This judgment is based on our ability to directly address suicide risk, implement suicide prevention strategies, and develop a safety plan while the patient is in the clinical setting. Please contact our team if there is a concern that risk level has changed.  CSSR Risk Category:C-SSRS RISK CATEGORY: No Risk  Suicide Risk Assessment: Patient has following modifiable risk factors for suicide: lack of access to outpatient mental health resources, which we are addressing by recommending rehab services/outpatient treatment programs. Patient has following non-modifiable or demographic risk factors for suicide: male gender Patient has the following protective factors against suicide: Supportive family, no history of suicide attempts, and no history of NSSIB  Thank you for this consult request. Recommendations have been communicated to the primary team.  We will continue current treatment at this time.   Mark Mula, MD       History of Present Illness  Mark Gordon is a 43 y.o. male with a significant hx  of alcohol abuse brought in by EMS for alcohol intoxication and frequent falls. Mother called EMS the night before admission for concerning of frequent falls at home and repeated alcohol intoxication.  Patient presented with confusion and could  not remember what has happened.  Patient denied any headache double vision numbness weakness of any of the limbs and he could not  remember if he ever fell.  Patient's mother reports he is not telling the truth, that he drinks more  than 16 beers and uses liquors as well. She reports he also smokes Marijuana and vapes every day. Patient was seen by psychiatry as initial consult on 01/16/24 and is noted to be psychiatrically stable and recommended outpatient substance use resources. Today psychiatry is called back to evaluate the need for IVC.  01/21/24:On assessment patient is noted to be resting in bed.  Remains irritable but answer the questions.  He is able to identify that he is at hospital [facility]  When asked about the medical problems he has initially responded "I do not know ".  When provider asked close general questions about neurosurgery helping him with the head injury he did acknowledge that he had head injury but reports that he is feeling well.  He denies feeling depressed or anxious and denies SI/HI/intent/plan.  He did acknowledge living with his family especially his mother.  He denies auditory/visual hallucinations.  When provider asked about his concerns about the treatment patient reports that "he is agreeing to what ever they want to do so that he can go home tomorrow".  When asked about his mood lability patient reports that "he is sick and tired of answering the same  questions to everybody and states that he is ready to go home ".  Provider spoke with patient's mom and informed about patient's mental status.  Mom reports that patient will be coming home soon and she wants patient to be participating in PT.  She had no other concerns. Discussed the case with Mark Gordon who informed that patient has been medically stable and neurosurgery is recommending outpatient follow-up.    Review of Systems  Constitutional: Negative.   HENT: Negative.    Eyes: Negative.   Respiratory: Negative.    Cardiovascular: Negative.   Gastrointestinal: Negative.   Genitourinary: Negative.   Musculoskeletal: Negative.   Skin: Negative.   Neurological: Negative.   Endo/Heme/Allergies: Negative.   Psychiatric/Behavioral:  Positive for  substance abuse. The patient is nervous/anxious.      Psychiatric and Social History  Psychiatric History:  Information collected from patient and mother  Prev Dx/Sx: Alcohol abuse Current Psych Provider: NA Home Meds (current): NA Previous Med Trials: NA Therapy: NA  Prior Psych Hospitalization: NA  Prior Self Harm: NA Prior Violence: NA  Family Psych History: Grandfather was alcoholic. Mother has MDD, GAD Family Hx suicide: NA  Social History:  Developmental Hx: NA Educational Hx: NA Occupational Hx: NA Legal Hx: NA Living Situation: Lives with mother Spiritual Hx: NA Access to weapons/lethal means: NA   Substance History Alcohol: current use  Type of alcohol Mainly beer but also liquor Last Drink Last night at 2 am Number of drinks per day 16 or more History of alcohol withdrawal seizures NA History of DT's NA Tobacco: Vapes Nicotine  Illicit drugs: Uses Marijuana Prescription drug abuse: NA Rehab hx: NA  Exam Findings  Physical Exam:  Vital Signs:  Temp:  [98.2 F (36.8 C)-98.4 F (36.9 C)] 98.2 F (36.8 C) (06/03 0839) Pulse Rate:  [65-82] 82 (06/03 0839) Resp:  [18] 18 (06/03 0839) BP: (120-134)/(68-83) 121/68 (06/03 0839) SpO2:  [94 %-98 %] 94 % (06/03 0839) Blood pressure 121/68, pulse 82, temperature 98.2 F (36.8 C), temperature source Oral, resp. rate 18, height 6\' 2"  (1.88 m), weight 134.9 kg, SpO2 94%. Body mass index is 38.18 kg/m.  Physical Exam Vitals reviewed.  Constitutional:      Appearance: He is obese. He is ill-appearing.  HENT:     Head: Normocephalic and atraumatic.     Left Ear: Tympanic membrane normal.     Nose: Nose normal.     Mouth/Throat:     Mouth: Mucous membranes are moist.  Eyes:     Extraocular Movements: Extraocular movements intact.     Pupils: Pupils are equal, round, and reactive to light.  Cardiovascular:     Rate and Rhythm: Normal rate.     Pulses: Normal pulses.  Pulmonary:     Effort: Pulmonary effort  is normal.  Musculoskeletal:        General: Normal range of motion.     Cervical back: Normal range of motion.  Neurological:     General: No focal deficit present.     Mental Status Exam: General Appearance: Well-groomed and stated age  Orientation:  Full (Time, Place, and Person)  Memory:  Immediate;   Fair Recent;   Fair Remote;   Fair  Concentration:  Concentration: Poor and Attention Span: Poor  Recall:  Fair  Attention  Poor  Eye Contact:  Poor  Speech: Slow  Language:  Fair  Volume:  Decreased  Mood: Fine  Affect:  Blunt  Thought Process:  Coherent  Thought Content:  WDL  Suicidal Thoughts:  No  Homicidal Thoughts:  No  Judgement:  Poor  Insight:  Fair  Psychomotor Activity: Resting  Akathisia:  NA  Fund of Knowledge:  Fair      Assets:  Manufacturing systems engineer Desire for Improvement Housing Social Support  Cognition:  WNL  ADL's:  Intact  AIMS (if indicated):        Other History   These have been pulled in through the EMR, reviewed, and updated if appropriate.  Family History:  The patient's family history is not on file.  Medical History: History reviewed. No pertinent past medical history.  Surgical History: Past Surgical History:  Procedure Laterality Date   TYMPANOSTOMY TUBE PLACEMENT       Medications:   Current Facility-Administered Medications:    acetaminophen  (TYLENOL ) tablet 650 mg, 650 mg, Oral, Q6H PRN, Mark, Ping T, MD, 650 mg at 01/21/24 1505   feeding supplement (ENSURE PLUS HIGH PROTEIN) liquid 237 mL, 237 mL, Oral, BID BM, Gordon, Harmeet, MD   folic acid  (FOLVITE ) tablet 1 mg, 1 mg, Oral, Daily, Mark Batty T, MD, 1 mg at 01/21/24 1914   labetalol  (NORMODYNE ) injection 20 mg, 20 mg, Intravenous, Q4H PRN, Mark, Ping T, MD, 20 mg at 01/19/24 1613   levETIRAcetam  (KEPPRA ) tablet 500 mg, 500 mg, Oral, BID, Mark Batty T, MD, 500 mg at 01/21/24 2100   multivitamin with minerals tablet 1 tablet, 1 tablet, Oral, Daily, Mark Batty  T, MD, 1 tablet at 01/21/24 7829   ondansetron  (ZOFRAN ) injection 4 mg, 4 mg, Intravenous, Q6H PRN, Mark Batty T, MD, 4 mg at 01/16/24 1530   PHENobarbital (LUMINAL) tablet 64.8 mg, 64.8 mg, Oral, TID, 64.8 mg at 01/21/24 2100 **FOLLOWED BY** [START ON 01/22/2024] PHENobarbital (LUMINAL) tablet 32.4 mg, 32.4 mg, Oral, TID, Gordon, Sumayya, MD   sodium chloride  tablet 2 g, 2 g, Oral, BID WC, Gordon, Sumayya, MD, 2 g at 01/21/24 1727   [EXPIRED] thiamine  (VITAMIN B1) 500 mg in sodium chloride  0.9 % 50 mL IVPB, 500 mg, Intravenous, TID, Last Rate: 110 mL/hr at 01/20/24 2102, 500 mg at 01/20/24 2102 **FOLLOWED BY** thiamine  (VITAMIN B1) 500 mg in sodium chloride  0.9 % 50 mL IVPB, 500 mg, Intravenous, Q24H, Gordon, Sumayya, MD, Last Rate: 110 mL/hr at 01/21/24 0907, 500 mg at 01/21/24 5621  Allergies: No Known Allergies  Mark Ericsson, MD

## 2024-01-22 LAB — BASIC METABOLIC PANEL WITH GFR
Anion gap: 8 (ref 5–15)
BUN: 24 mg/dL — ABNORMAL HIGH (ref 6–20)
CO2: 21 mmol/L — ABNORMAL LOW (ref 22–32)
Calcium: 9.3 mg/dL (ref 8.9–10.3)
Chloride: 100 mmol/L (ref 98–111)
Creatinine, Ser: 1.16 mg/dL (ref 0.61–1.24)
GFR, Estimated: >60 mL/min (ref >60)
Glucose, Bld: 85 mg/dL (ref 70–99)
Potassium: 4.4 mmol/L (ref 3.5–5.1)
Sodium: 129 mmol/L — ABNORMAL LOW (ref 135–145)

## 2024-01-22 MED ORDER — ENOXAPARIN SODIUM 80 MG/0.8ML IJ SOSY: 0.5000 mg/kg | PREFILLED_SYRINGE | Freq: Every evening | INTRAMUSCULAR | Status: DC

## 2024-01-22 MED ORDER — ENOXAPARIN SODIUM 40 MG/0.4ML IJ SOSY: 40.0000 mg | PREFILLED_SYRINGE | Freq: Every evening | INTRAMUSCULAR | Status: DC

## 2024-01-22 NOTE — Progress Notes (Signed)
 Progress Note    Mark Gordon  HYQ:[id number] DOB: 09-25-1980  DOA: 01/16/2024 PCP: Patient, No Pcp Per      Brief Narrative:    Medical records reviewed and are as summarized below:   Mark Gordon is a 43 y.o. male with medical history significant of alcohol abuse brought in by EMS for alcohol intoxication and frequent falls.   On presentation vital stable, labs pertinent for hyponatremia with sodium at 124, bicarb 17.CT head showed multiple SDH and SAH, no midline shift or mass effect.   Neurosurgery was consulted and CT head was repeated 6 hours later which shows stable SAH and slight increase in SDH.  Neurosurgery recommending conservative management. CTA head and neck was negative for any traumatic vascular injury or any proximal intracranial LVO. Started on Keppra  for seizure prevention for 1 week. Patient was threatening to leave so [facility] by ED provider for safety.  5/30: Vital stable, improving sodium, currently at 130, stable thrombocytopenia with platelet at 120.  Elevated ethanol levels at 286 and UDS positive for cannabinoid, magnesium, ammonia, salicylate and Tylenol  levels were normal.  CIWA score of 4.  Sitter at bedside Repeat CT head this morning seems stable.  5/31: Hemodynamically stable, intermittent agitation, still requiring sitter. Starting on phenobarbital taper and high-dose thiamine . Worsening hyponatremia with continuation of normal saline so it was discontinued-doing fluid restriction and adding salt tablet.  6/1: Hemodynamically stable, intermittent agitation and wants to get out of the hospital.  Refusing to work with physical therapy.  Sodium with further decreased to 126-increasing the dose of salt tablets and nephrology was also consulted.  Also concern of cerebral salt wasting with recent [facility].  6/2: Hemodynamically stable but remained intermittently agitated.  Sodium at 128 today after increasing the dose of salt tablets.  Awaiting  nephrology recommendations for concern of cerebral salt wasting. Switching to p.o. phenobarbital. Continue to refuse working with physical therapy.  6/3: Hemodynamically stable, refusing most of the care.  Sodium today at 127.  If continue to decrease then nephrology might do 3% saline. Psych was again contacted for reevaluation.  Refusing to work with PT and mother does not want him back without proper evaluation.  Getting high-dose thiamine  for concern of Warnicke with ataxia, differential remain between alcohol intoxication versus Warnicke.      Assessment/Plan:   Principal Problem:   SDH (subdural hematoma) (HCC) Active Problems:   SAH (subarachnoid hemorrhage) (HCC)   Alcohol intoxication (HCC)   Hyponatremia   Hypokalemia   Cigarette smoker   Obesity, Class II, BMI 35-39.9    Body mass index is 38.18 kg/m.  (Class II obesity)   Traumatic subdural hematoma, subarachnoid hemorrhage, stable nondisplaced right occipital bone fracture, questionable small fracture involving the temporal bone: This is likely due to alcohol use disorder and frequent falls. Case discussed with Dr. Mont Antis.  He recommended conservative management.  Plan to complete 7 days of Keppra  for seizure prophylaxis today.   Hyponatremia: Overall, this has improved from 124-129.  He has fluctuating sodium levels.  Continue salt tablets.  Follow-up with nephrologist.   Acute confusional state, metabolic encephalopathy: Exact etiology unclear.  May be related to delirium, intracranial bleed, alcohol use disorder.  Patient was evaluated by the psychiatrist.  He remains under IVC for now.  He has a Comptroller at the bedside.  Continue thiamine  and phenobarbital He had refused to work with PT.  Fortunately, he has been ambulating independently in his room, although he requires  redirection.   Hypokalemia: Improved   Comorbidities include tobacco use disorder, class II obesity    Diet Order             Diet  regular Fluid consistency: Thin; Fluid restriction: 2000 mL Fluid  Diet effective now                            Consultants: Neurosurgeon Psychiatrist  Procedures: None    Medications:    feeding supplement  237 mL Oral BID BM   folic acid   1 mg Oral Daily   gabapentin  300 mg Oral BID   levETIRAcetam   500 mg Oral BID   multivitamin with minerals  1 tablet Oral Daily   phenobarbital  32.4 mg Oral TID   sodium chloride   2 g Oral BID WC   Continuous Infusions:  thiamine  (VITAMIN B1) injection 500 mg (01/21/24 0907)     Anti-infectives (From admission, onward)    None              Family Communication/Anticipated D/C date and plan/Code Status   DVT prophylaxis: SCDs Start: 01/16/24 0757     Code Status: Full Code  Family Communication: Plan discussed with his mother over the phone Disposition Plan: Plan to discharge home tomorrow   Status is: Inpatient Remains inpatient appropriate because: Confusion, encephalopathy       Subjective:   Interval events noted.  No complaints.  Still has some confusion.  He said he wants to go home.  He said his mother will come and take him home today.  Sitter at the bedside.  Objective:    Vitals:   01/20/24 1936 01/20/24 2358 01/21/24 0345 01/21/24 0839  BP: 138/83 134/83 120/76 121/68  Pulse: 71 65 70 82  Resp: 18 18 18 18   Temp:   98.2 F (36.8 C) 98.2 F (36.8 C)  TempSrc:    Oral  SpO2: 98% 98% 96% 94%  Weight:      Height:       No data found.   Intake/Output Summary (Last 24 hours) at 01/22/2024 1040 Last data filed at 01/21/2024 1800 Gross per 24 hour  Intake 56.5 ml  Output --  Net 56.5 ml   Filed Weights   01/16/24 0351  Weight: 134.9 kg    Exam:  GEN: NAD SKIN: Warm and dry EYES: No pallor or icterus ENT: MMM CV: RRR PULM: CTA B ABD: soft, ND, NT, +BS CNS: AAO x 2 (oriented to person and place), non focal EXT: No edema or tenderness        Data Reviewed:    I have personally reviewed following labs and imaging studies:  Labs: Labs show the following:   Basic Metabolic Panel: Recent Labs  Lab 01/16/24 0401 01/16/24 1131 01/18/24 0855 01/19/24 0911 01/20/24 1149 01/21/24 1322 01/22/24 0525  NA 124*   < > 127* 126* 128* 127* 129*  K 3.2*   < > 4.1 4.2 4.3 4.3 4.4  CL 93*   < > 95* 94* 96* 96* 100  CO2 17*   < > 21* 22 21* 19* 21*  GLUCOSE 115*   < > 93 94 88 85 85  BUN 7   < > 10 14 18 20  24*  CREATININE 0.96   < > 0.96 1.03 1.03 1.12 1.16  CALCIUM 8.3*   < > 8.9 9.2 9.3 9.2 9.3  MG 2.2  --   --   --   --   --   --  PHOS  --   --  3.2  --   --   --   --    < > = values in this interval not displayed.   GFR Estimated Creatinine Clearance: 120 mL/min (by C-G formula based on SCr of 1.16 mg/dL). Liver Function Tests: Recent Labs  Lab 01/16/24 0401 01/18/24 0855  AST 35  --   ALT 34  --   ALKPHOS 86  --   BILITOT 1.1  --   PROT 6.8  --   ALBUMIN 3.7 3.7   No results for input(s): "LIPASE", "AMYLASE" in the last 168 hours. Recent Labs  Lab 01/16/24 0427  AMMONIA 20   Coagulation profile Recent Labs  Lab 01/16/24 0427  INR 1.1    CBC: Recent Labs  Lab 01/16/24 0401 01/17/24 0536 01/19/24 0911  WBC 4.8 6.9 7.2  NEUTROABS 3.5  --   --   HGB 16.4 16.6 17.7*  HCT 45.8 45.7 49.3  MCV 97.7 98.5 97.0  PLT 121* 120* 145*   Cardiac Enzymes: Recent Labs  Lab 01/16/24 0401  CKTOTAL 178   BNP (last 3 results) No results for input(s): "PROBNP" in the last 8760 hours. CBG: Recent Labs  Lab 01/16/24 0436  GLUCAP 127*   D-Dimer: No results for input(s): "DDIMER" in the last 72 hours. Hgb A1c: No results for input(s): "HGBA1C" in the last 72 hours. Lipid Profile: No results for input(s): "CHOL", "HDL", "LDLCALC", "TRIG", "CHOLHDL", "LDLDIRECT" in the last 72 hours. Thyroid function studies: No results for input(s): "TSH", "T4TOTAL", "T3FREE", "THYROIDAB" in the last 72 hours.  Invalid input(s):  "FREET3" Anemia work up: No results for input(s): "VITAMINB12", "FOLATE", "FERRITIN", "TIBC", "IRON", "RETICCTPCT" in the last 72 hours. Sepsis Labs: Recent Labs  Lab 01/16/24 0401 01/17/24 0536 01/19/24 0911  WBC 4.8 6.9 7.2    Microbiology No results found for this or any previous visit (from the past 240 hours).  Procedures and diagnostic studies:  No results found.             LOS: 6 days   Jwan Hornbaker  [facility]   Pager on [email or web address]. If 7PM-7AM, please contact night-coverage at [email or web address]     01/22/2024, 10:40 AM

## 2024-01-22 NOTE — Consult Note (Signed)
 [facility] Psychiatric Consult Follow up  Patient Name: .Mark Gordon  MRN: [id number]  DOB: 1981-01-14  Consult Order details:  Orders (From admission, onward)     Start     Ordered   01/16/24 1126  IP CONSULT TO PSYCHIATRY       Ordering Provider: [place], MD  Provider:  (Not yet assigned)  Question Answer Comment  Location [facility]   Reason for Consult? 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 male admitted: Medicallyfor 01/16/2024  3:43 AM  following a fall at home in the context of alcohol intoxication. He carries the psychiatric diagnoses of Alcohol abuse and has no documented  past medical history.  Patient has been evaluated and treated for subdural hematoma by neurosurgery.  Patient is noted to be irritable during the hospital stay but has allowed the treatment to go on.  Patient was started on Keppra  as a precaution for seizures.  Patient is completing the phenobarbital taper for alcohol detox.    Mom has no concerns.  Primary team later on informed the provider that patient has completed his treatment and has no acute medical issues.   01/22/24: On assessment patient is calm and cooperative.  He agreed with the plan of going home tomorrow with his mother.  There are no other safety concerns as he consistently denies SI/HI/plan.  He reports he has more support from family at home.  Medical team has confirmed that no other medical intervention is pending at this point.  There is no indication of continued involuntary commitment at this time.  Rescinded IVC today.   Diagnoses:  Active Hospital  problems: Principal Problem:   SDH (subdural hematoma) (HCC) Active Problems:   Alcohol intoxication (HCC)   SAH (subarachnoid hemorrhage) (HCC)   Hyponatremia   Hypokalemia   Cigarette smoker   Obesity, Class II, BMI 35-39.9    Plan   ## Psychiatric Medication Recommendations:  Continue gabapentin 300 gm bID to help with chronic alcoholism, anxiety/mood lability. Most of the antipsychotics/mood stabilizers cause hyponatremia so recommending conservative treatment for mood at this time. Continue current treatment. Continue to monitor  ## Medical Decision Making Capacity: Patient is able to acknowledge that he is in the hospital and acknowledge that neurosurgery has been helping him but remains minimally engaged in giving the details about his treatment.  He is oriented to self, situation.  Given the information obtained by the primary team that there is no medical emergency and there is no life-saving treatment pending at this time and they also informed that patient is in discharge phase, psychiatry finds no reason to maintain IVC at this point.  patient is consistent with his responses of agreeing to engage in what ever treatment that is needed for him to be able to get discharged home. Released IVC today  ## Further Work-up:  -No other workup indicated at this time   ## Disposition:-- Plan Post Discharge/Psychiatric Care Follow-up resources for Substance abuse outpatient services/rehab programs  ## Behavioral / Environmental: - No specific recommendations at this time.     ##  Safety and Observation Level:  - Based on my clinical evaluation, I estimate the patient to be at low risk of self harm in the current setting. - At this time, we recommend  routine. This decision is based on my review of the chart including patient's history and current presentation, interview of the patient, mental status examination, and consideration of suicide risk including evaluating suicidal ideation,  plan, intent, suicidal or self-harm behaviors, risk factors, and protective factors. This judgment is based on our ability to directly address suicide risk, implement suicide prevention strategies, and develop a safety plan while the patient is in the clinical setting. Please contact our team if there is a concern that risk level has changed.  CSSR Risk Category:C-SSRS RISK CATEGORY: No Risk  Suicide Risk Assessment: Patient has following modifiable risk factors for suicide: lack of access to outpatient mental health resources, which we are addressing by recommending rehab services/outpatient treatment programs. Patient has following non-modifiable or demographic risk factors for suicide: male gender Patient has the following protective factors against suicide: Supportive family, no history of suicide attempts, and no history of NSSIB  Thank you for this consult request. Recommendations have been communicated to the primary team.  We will sign off at this time.   Chantia Amalfitano, MD       History of Present Illness  Mark Gordon is a 44 y.o. male with a significant hx  of alcohol abuse brought in by EMS for alcohol intoxication and frequent falls. Mother called EMS the night before admission for concerning of frequent falls at home and repeated alcohol intoxication.  Patient presented with confusion and could  not remember what has happened.  Patient denied any headache double vision numbness weakness of any of the limbs and he could not  remember if he ever fell.  Patient's mother reports he is not telling the truth, that he drinks more than 16 beers and uses liquors as well. She reports he also smokes Marijuana and vapes every day. Patient was seen by psychiatry as initial consult on 01/16/24 and is noted to be psychiatrically stable and recommended outpatient substance use resources. Today psychiatry is called back to evaluate the need for IVC.  01/22/24: Today on interview patient is noted to  be resting.  He offers no other complaints.  He reports feeling fine.  He did acknowledge that his mother talk to him and she will be coming tomorrow to pick him up.  He reports that he was sick and tired of staying in the hospital and states he has a big family support at home.  He reports that he will figure out his way out of the bed and will start working on his ADLs.  He denies auditory/visual hallucinations.  He denies SI/HI/plan.  He denies depression and anxiety.   01/21/24: Collateral: Provider spoke with patient's mom and informed about patient's mental status.  Mom reports that patient will be coming home soon and she wants patient to be participating in PT.  She had no other concerns. Discussed the case with Dr. Ariel Begun who informed that patient has been medically stable and neurosurgery is recommending outpatient follow-up.    Review of Systems  Constitutional: Negative.   HENT: Negative.    Eyes: Negative.   Respiratory: Negative.    Cardiovascular: Negative.   Gastrointestinal: Negative.   Genitourinary: Negative.   Musculoskeletal: Negative.   Skin: Negative.   Neurological: Negative.   Endo/Heme/Allergies: Negative.   Psychiatric/Behavioral:  Positive for substance abuse.  The patient is nervous/anxious.      Psychiatric and Social History  Psychiatric History:  Information collected from patient and mother  Prev Dx/Sx: Alcohol abuse Current Psych Provider: NA Home Meds (current): NA Previous Med Trials: NA Therapy: NA  Prior Psych Hospitalization: NA  Prior Self Harm: NA Prior Violence: NA  Family Psych History: Grandfather was alcoholic. Mother has MDD, GAD Family Hx suicide: NA  Social History:  Developmental Hx: NA Educational Hx: NA Occupational Hx: NA Legal Hx: NA Living Situation: Lives with mother Spiritual Hx: NA Access to weapons/lethal means: NA   Substance History Alcohol: current use  Type of alcohol Mainly beer but also liquor Last Drink  Last night at 2 am Number of drinks per day 16 or more History of alcohol withdrawal seizures NA History of DT's NA Tobacco: Vapes Nicotine  Illicit drugs: Uses Marijuana Prescription drug abuse: NA Rehab hx: NA  Exam Findings  Physical Exam:  Vital Signs:    Blood pressure 121/68, pulse 82, temperature 98.2 F (36.8 C), temperature source Oral, resp. rate 18, height 6\' 2"  (1.88 m), weight 134.9 kg, SpO2 94%. Body mass index is 38.18 kg/m.  Physical Exam Vitals reviewed.  Constitutional:      Appearance: He is obese. He is ill-appearing.  HENT:     Head: Normocephalic and atraumatic.     Left Ear: Tympanic membrane normal.     Nose: Nose normal.     Mouth/Throat:     Mouth: Mucous membranes are moist.  Eyes:     Extraocular Movements: Extraocular movements intact.     Pupils: Pupils are equal, round, and reactive to light.  Cardiovascular:     Rate and Rhythm: Normal rate.     Pulses: Normal pulses.  Pulmonary:     Effort: Pulmonary effort is normal.  Musculoskeletal:        General: Normal range of motion.     Cervical back: Normal range of motion.  Neurological:     General: No focal deficit present.     Mental Status Exam: General Appearance: Well-groomed and stated age  Orientation:  Full (Time, Place, and Person)  Memory:  Immediate;   Fair Recent;   Fair Remote;   Fair  Concentration:  Concentration: Poor and Attention Span: Poor  Recall:  Fair  Attention  Poor  Eye Contact:  Poor  Speech: Slow  Language:  Fair  Volume:  Decreased  Mood: Fine  Affect:  Blunt  Thought Process:  Coherent  Thought Content:  WDL  Suicidal Thoughts:  No  Homicidal Thoughts:  No  Judgement:  Poor  Insight:  Fair  Psychomotor Activity: Resting  Akathisia:  NA  Fund of Knowledge:  Fair      Assets:  Manufacturing systems engineer Desire for Improvement Housing Social Support  Cognition:  WNL  ADL's:  Intact  AIMS (if indicated):        Other History   These have been  pulled in through the EMR, reviewed, and updated if appropriate.  Family History:  The patient's family history is not on file.  Medical History: History reviewed. No pertinent past medical history.  Surgical History: Past Surgical History:  Procedure Laterality Date   TYMPANOSTOMY TUBE PLACEMENT       Medications:   Current Facility-Administered Medications:    acetaminophen  (TYLENOL ) tablet 650 mg, 650 mg, Oral, Q6H PRN, Zhang, Ping T, MD, 650 mg at 01/22/24 0921   enoxaparin (LOVENOX) injection 67.5 mg, 0.5 mg/kg, Subcutaneous, QPM, Sheril Dines,  MD   feeding supplement (ENSURE PLUS HIGH PROTEIN) liquid 237 mL, 237 mL, Oral, BID BM, Singh, Harmeet, MD   folic acid  (FOLVITE ) tablet 1 mg, 1 mg, Oral, Daily, Antoniette Batty T, MD, 1 mg at 01/22/24 1610   gabapentin (NEURONTIN) capsule 300 mg, 300 mg, Oral, BID, Lounette Sloan, MD, 300 mg at 01/22/24 9604   labetalol  (NORMODYNE ) injection 20 mg, 20 mg, Intravenous, Q4H PRN, Zhang, Ping T, MD, 20 mg at 01/19/24 1613   levETIRAcetam  (KEPPRA ) tablet 500 mg, 500 mg, Oral, BID, Zhang, Ping T, MD, 500 mg at 01/22/24 0921   multivitamin with minerals tablet 1 tablet, 1 tablet, Oral, Daily, Antoniette Batty T, MD, 1 tablet at 01/22/24 5409   ondansetron  (ZOFRAN ) injection 4 mg, 4 mg, Intravenous, Q6H PRN, Zhang, Ping T, MD, 4 mg at 01/16/24 1530   [COMPLETED] PHENobarbital (LUMINAL) tablet 64.8 mg, 64.8 mg, Oral, TID, 64.8 mg at 01/22/24 0921 **FOLLOWED BY** PHENobarbital (LUMINAL) tablet 32.4 mg, 32.4 mg, Oral, TID, Amin, Sumayya, MD   sodium chloride  tablet 2 g, 2 g, Oral, BID WC, Amin, Sumayya, MD, 2 g at 01/22/24 0921   [EXPIRED] thiamine  (VITAMIN B1) 500 mg in sodium chloride  0.9 % 50 mL IVPB, 500 mg, Intravenous, TID, Last Rate: 110 mL/hr at 01/20/24 2102, 500 mg at 01/20/24 2102 **FOLLOWED BY** thiamine  (VITAMIN B1) 500 mg in sodium chloride  0.9 % 50 mL IVPB, 500 mg, Intravenous, Q24H, Amin, Sumayya, MD, Last Rate: 110 mL/hr at 01/21/24 0907, 500  mg at 01/21/24 8119  Allergies: No Known Allergies  Srah Ake, MD

## 2024-01-22 NOTE — Progress Notes (Signed)
 [facility]  ROUNDING NOTE   Subjective:   Patient seen laying in bed Notes show he continues to refuse vitals  Sodium 129  Objective:  Vital signs in last 24 hours:     Weight change:  Filed Weights   01/16/24 0351  Weight: 134.9 kg    Intake/Output: I/O last 3 completed shifts: In: 56.5 [IV Piggyback:56.5] Out: -    Intake/Output this shift:  No intake/output data recorded.  Physical Exam: General: NAD  Head: Normocephalic, atraumatic.   Eyes: Anicteric  Lungs:  Clear to auscultation  Heart: Regular rate and rhythm  Abdomen:  Soft, nontender  Extremities:  No peripheral edema.  Neurologic: Awake, alert, conversant  Skin: Warm,dry, no rash       Basic Metabolic Panel: Recent Labs  Lab 01/16/24 0401 01/16/24 1131 01/18/24 0855 01/19/24 0911 01/20/24 1149 01/21/24 1322 01/22/24 0525  NA 124*   < > 127* 126* 128* 127* 129*  K 3.2*   < > 4.1 4.2 4.3 4.3 4.4  CL 93*   < > 95* 94* 96* 96* 100  CO2 17*   < > 21* 22 21* 19* 21*  GLUCOSE 115*   < > 93 94 88 85 85  BUN 7   < > 10 14 18 20  24*  CREATININE 0.96   < > 0.96 1.03 1.03 1.12 1.16  CALCIUM 8.3*   < > 8.9 9.2 9.3 9.2 9.3  MG 2.2  --   --   --   --   --   --   PHOS  --   --  3.2  --   --   --   --    < > = values in this interval not displayed.    Liver Function Tests: Recent Labs  Lab 01/16/24 0401 01/18/24 0855  AST 35  --   ALT 34  --   ALKPHOS 86  --   BILITOT 1.1  --   PROT 6.8  --   ALBUMIN 3.7 3.7   No results for input(s): "LIPASE", "AMYLASE" in the last 168 hours. Recent Labs  Lab 01/16/24 0427  AMMONIA 20    CBC: Recent Labs  Lab 01/16/24 0401 01/17/24 0536 01/19/24 0911  WBC 4.8 6.9 7.2  NEUTROABS 3.5  --   --   HGB 16.4 16.6 17.7*  HCT 45.8 45.7 49.3  MCV 97.7 98.5 97.0  PLT 121* 120* 145*    Cardiac Enzymes: Recent Labs  Lab 01/16/24 0401  CKTOTAL 178    BNP: Invalid input(s): "POCBNP"  CBG: Recent Labs  Lab 01/16/24 0436  GLUCAP  127*    Microbiology: No results found for this or any previous visit.  Coagulation Studies: No results for input(s): "LABPROT", "INR" in the last 72 hours.  Urinalysis: No results for input(s): "COLORURINE", "LABSPEC", "PHURINE", "GLUCOSEU", "HGBUR", "BILIRUBINUR", "KETONESUR", "PROTEINUR", "UROBILINOGEN", "NITRITE", "LEUKOCYTESUR" in the last 72 hours.  Invalid input(s): "APPERANCEUR"    Imaging: No results found.   Medications:    thiamine  (VITAMIN B1) injection 500 mg (01/21/24 0907)    enoxaparin (LOVENOX) injection  0.5 mg/kg Subcutaneous QPM   feeding supplement  237 mL Oral BID BM   folic acid   1 mg Oral Daily   gabapentin  300 mg Oral BID   levETIRAcetam   500 mg Oral BID   multivitamin with minerals  1 tablet Oral Daily   phenobarbital  32.4 mg Oral TID   sodium chloride   2 g Oral BID WC  acetaminophen , labetalol , ondansetron  (ZOFRAN ) IV  Assessment/ Plan:  Mark Gordon is a 43 y.o.  male with medical problems of alcohol abuse was admitted on 01/16/2024 for Hyponatremia [E87.1] SAH (subarachnoid hemorrhage) (HCC) [I60.9] SDH (subdural hematoma) (HCC) [S06.5XAA] Skull fracture with cerebral contusion, closed, initial encounter (HCC) [S02.91XA, S06.33AA] Alcoholic intoxication with complication (HCC) [F10.929]   Hyponatremia Differential includes chronic hyponatremia due to alcohol abuse, with worsening of Na related to SIADH versus cerebral salt wasting. CSW is characterized by extracellular volume depletion such as hypotension and decreased skin turgor.   Sodium has improved slowly to 129. Continue current treatment with salt tabs and nutritional supplements.   LOS: 6 Mark Gordon 6/4/202511:09 AM

## 2024-01-22 NOTE — Plan of Care (Signed)

## 2024-01-22 NOTE — BH Assessment (Signed)
 IVC PAPERS  RESCINDED PER  DR  JADAPALLE  MD  INFORMED  RN  Mark Glazier  RN PT  NOW  VOLUNTARY

## 2024-01-22 NOTE — Plan of Care (Signed)

## 2024-01-22 NOTE — Progress Notes (Signed)
 Neurosurgery Progress Note  History: Mark Gordon is here for a traumatic intracranial hemorrhage as well as alcohol withdrawal and hyponatremia.  HD7: Feeling better, brighter HD6: Still poor PO intake. Mild HA. HD4: Continued confusion but walking and moving all limbs.  No agitation HD3: Some confusion when awake, but able to walk to restroom and MAEW. HD2: No complaints this morning.  He had to have a sitter overnight.  Physical Exam: Vitals:   01/21/24 0345 01/21/24 0839  BP: 120/76 121/68  Pulse: 70 82  Resp: 18 18  Temp: 98.2 F (36.8 C) 98.2 F (36.8 C)  SpO2: 96% 94%    AA Ox self, place, and date without choices Sensorium brighter, work up quickly CNI  Strength:MAEW 5/5  Data:  Other tests/results:  CT Head 01/16/2024 IMPRESSION: 1. Increased right inferolateral cerebellar hemisphere edema superimposed on stable small hemorrhagic contusion and right cerebellar SAH, suspicious for an acute/evolving Ischemic Infarct such as due to distal right vertebral artery injury. Follow-up CTA Head and Neck recommended. 2. Bilateral SDH has mildly progressed since 0413 hours today, now 6 mm and remaining symmetric. 3. Small right inferior frontal gyrus hemorrhagic contusions now are apparent (10-15 mm). 4. Stable SAH. 5. No midline shift or significant intracranial mass effect. No IVH or ventriculomegaly. 6. Stable nondisplaced right occipital bone fracture.   Electronically Signed: By: Marlise Simpers M.D. On: 01/16/2024 11:13  Na 128 yesterday - pending today  Assessment/Plan:  Mark Gordon is here with a traumatic intracranial hemorrhage.  He has multiple contusions on his recent CT scan.  He is currently being managed medically for hyponatremia.  - mobilize - pain control - DVT prophylaxis - ok to continue - PTOT - Ok to dc keppra  after 7 days - monitor sodium - goal >135      Jodeen Munch MD, [facility] Department of Neurosurgery

## 2024-01-23 ENCOUNTER — Other Ambulatory Visit: Payer: Self-pay

## 2024-01-23 ENCOUNTER — Other Ambulatory Visit: Payer: Self-pay | Admitting: Family Medicine

## 2024-01-23 ENCOUNTER — Telehealth: Payer: Self-pay | Admitting: Neurosurgery

## 2024-01-23 DIAGNOSIS — S065XAA Traumatic subdural hemorrhage with loss of consciousness status unknown, initial encounter: Secondary | ICD-10-CM

## 2024-01-23 LAB — BASIC METABOLIC PANEL WITH GFR
Anion gap: 11 (ref 5–15)
BUN: 19 mg/dL (ref 6–20)
CO2: 23 mmol/L (ref 22–32)
Calcium: 9.3 mg/dL (ref 8.9–10.3)
Chloride: 99 mmol/L (ref 98–111)
Creatinine, Ser: 1.18 mg/dL (ref 0.61–1.24)
GFR, Estimated: >60 mL/min (ref >60)
Glucose, Bld: 89 mg/dL (ref 70–99)
Potassium: 4.4 mmol/L (ref 3.5–5.1)
Sodium: 133 mmol/L — ABNORMAL LOW (ref 135–145)

## 2024-01-23 MED ORDER — VITAMIN B-1 100 MG PO TABS
100.0000 mg | ORAL_TABLET | Freq: Every day | ORAL | 0 refills | Status: AC
  Filled 2024-01-23: qty 30, 30d supply, fill #0

## 2024-01-23 NOTE — Discharge Summary (Signed)
 Physician Discharge Summary   Patient: Mark Gordon MRN: [id number] DOB: June 22, 1981  Admit date:     01/16/2024  Discharge date: 01/23/24  Discharge Physician: Sheril Dines   PCP: Patient, No Pcp Per   Recommendations at discharge:   Recommend follow-up with PCP in 2 weeks Outpatient follow-up with Dr. Jeris Montes, neurosurgeon (office will schedule appointment)  Discharge Diagnoses: Principal Problem:   SDH (subdural hematoma) (HCC) Active Problems:   SAH (subarachnoid hemorrhage) (HCC)   Alcohol intoxication (HCC)   Hyponatremia   Hypokalemia   Cigarette smoker   Obesity, Class II, BMI 35-39.9  Resolved Problems:   * No resolved hospital problems. *  Hospital Course:  Mark Gordon is a 43 y.o. male with medical history significant of alcohol abuse brought in by EMS for alcohol intoxication and frequent falls.    On presentation vital stable, labs pertinent for hyponatremia with sodium at 124, bicarb 17.CT head showed multiple SDH and SAH, no midline shift or mass effect.    Neurosurgery was consulted and CT head was repeated 6 hours later which shows stable SAH and slight increase in SDH.  Neurosurgery recommending conservative management. CTA head and neck was negative for any traumatic vascular injury or any proximal intracranial LVO. Started on Keppra  for seizure prevention for 1 week. Patient was threatening to leave so [facility] by ED provider for safety.   5/30: Vital stable, improving sodium, currently at 130, stable thrombocytopenia with platelet at 120.  Elevated ethanol levels at 286 and UDS positive for cannabinoid, magnesium, ammonia, salicylate and Tylenol  levels were normal.  CIWA score of 4.  Sitter at bedside Repeat CT head this morning seems stable.   5/31: Hemodynamically stable, intermittent agitation, still requiring sitter. Starting on phenobarbital taper and high-dose thiamine . Worsening hyponatremia with continuation of normal saline so it  was discontinued-doing fluid restriction and adding salt tablet.   6/1: Hemodynamically stable, intermittent agitation and wants to get out of the hospital.  Refusing to work with physical therapy.  Sodium with further decreased to 126-increasing the dose of salt tablets and nephrology was also consulted.  Also concern of cerebral salt wasting with recent [facility]   6/2: Hemodynamically stable but remained intermittently agitated.  Sodium at 128 today after increasing the dose of salt tablets.  Awaiting nephrology recommendations for concern of cerebral salt wasting. Switching to p.o. phenobarbital. Continue to refuse working with physical therapy.   6/3: Hemodynamically stable, refusing most of the care.  Sodium today at 127.  If continue to decrease then nephrology might do 3% saline. Psych was again contacted for reevaluation.  Refusing to work with PT and mother does not want him back without proper evaluation.  Getting high-dose thiamine  for concern of Warnicke with ataxia, differential remain between alcohol intoxication versus Warnicke.        Assessment and Plan:   Traumatic subdural hematoma, subarachnoid hemorrhage, stable nondisplaced right occipital bone fracture, questionable small fracture involving the temporal bone: This is likely due to alcohol use disorder and frequent falls. Completed 7 days of Keppra  for seizure prophylaxis. Outpatient follow-up with Dr. Jeris Montes, neurosurgeon.      Hyponatremia: Improved from 124-133.  He was treated with IV fluids and salt tablets.  Appreciate help from nephrologist     Acute confusional state, metabolic encephalopathy: Mental status has improved.  Exact etiology unclear.  May be related to delirium, intracranial bleed, alcohol use disorder.  Patient was evaluated by the psychiatrist.  IVC has been canceled.   He  will be discharged on thiamine .     Hypokalemia: Improved     Comorbidities include tobacco use disorder, class II  obesity     His condition has improved and he is deemed stable for discharge to home today.       Consultants: Nephrologist, neurosurgeon Procedures performed: None Disposition: Home Diet recommendation:  Discharge Diet Orders (From admission, onward)     Start     Ordered   01/23/24 0000  Diet - low sodium heart healthy        01/23/24 0953           Cardiac diet DISCHARGE MEDICATION: Allergies as of 01/23/2024   No Known Allergies      Medication List     STOP taking these medications    ondansetron  4 MG disintegrating tablet Commonly known as: ZOFRAN -ODT       TAKE these medications    acetaminophen  325 MG tablet Commonly known as: TYLENOL  Take 650 mg by mouth every 4 (four) hours as needed.   ibuprofen  200 MG tablet Commonly known as: ADVIL  Take 400 mg by mouth every 6 (six) hours as needed.   thiamine  100 MG tablet Commonly known as: VITAMIN B1 Take 1 tablet (100 mg total) by mouth daily.        Follow-up Information     Schedule an appointment as soon as possible for a visit  with Jodeen Munch, MD.   Specialty: Neurosurgery Contact information: [address] [place] [address] [phone]                Discharge Exam: Mark Gordon Weights   01/16/24 0351  Weight: 134.9 kg   GEN: NAD SKIN: Warm and dry EYES: No pallor or icterus ENT: MMM CV: RRR PULM: CTA B ABD: soft, ND, NT, +BS CNS: AAO x 3, disoriented to situation, non focal EXT: No edema or tenderness   Condition at discharge: good  The results of significant diagnostics from this hospitalization (including imaging, microbiology, ancillary and laboratory) are listed below for reference.   Imaging Studies: CT HEAD WO CONTRAST ( ) Result Date: 01/17/2024 CLINICAL DATA:  Provided history: Head trauma, abnormal mental status. EXAM: CT HEAD WITHOUT CONTRAST TECHNIQUE: Contiguous axial images were obtained from the base of the skull  through the vertex without intravenous contrast. RADIATION DOSE REDUCTION: This exam was performed according to the departmental dose-optimization program which includes automated exposure control, adjustment of the mA and/or kV according to patient size and/or use of iterative reconstruction technique. COMPARISON:  CT angiogram head/neck 01/16/2024. Prior head CT examinations 01/16/2024 and earlier. FINDINGS: Brain: Subdural hematomas along the bilateral cerebral convexities are unchanged in size (again measuring up to 6 mm in thickness on both sides). The subdural hematomas remain predominantly low density. Hemorrhagic parenchymal contusions within the anteroinferior frontal lobes bilaterally, non-progressed. Scattered subarachnoid hemorrhage along the supratentorial brain (greatest within the left sylvian fissure and along the left frontal lobe), non-progressed. Continued slight interval progression of edema at site of a hemorrhagic parenchymal contusion within the inferolateral right cerebellar hemisphere. No demarcated cortical infarct No midline shift or hydrocephalus. Vascular: No hyperdense vessel.  Atherosclerotic calcifications. Skull: Known acute non-depressed fracture of the right occipital calvarium (extending to the jugular foramen). Sinuses/Orbits: No acute orbital finding. Mild mucosal thickening within the bilateral frontal and ethmoid sinuses. Moderate mucosal thickening within the bilateral sphenoid sinuses. Other: New right mastoid effusion. IMPRESSION: 1. Subdural hematomas along the bilateral cerebral convexities are unchanged in size (again measuring  up to 6 mm in thickness on both sides). No midline shift. 2. Hemorrhagic parenchymal contusions within the anteroinferior frontal lobes, non-progressed. 3. Scattered subarachnoid hemorrhage along the supratentorial brain, non-progressed. 4. Continued slight interval progression of edema at site of a hemorrhagic parenchymal contusion within the  inferolateral right cerebellar hemisphere. 5. Known acute non-depressed fracture of the right occipital calvarium (extending to the jugular foramen). 6. New right mastoid effusion. Consider a temporal bone CT to exclude an occult right temporal bone fracture. 7. Paranasal sinus disease as described. Electronically Signed   By: Bascom Lily D.O.   On: 01/17/2024 10:57   CT ANGIO HEAD NECK W WO CM Result Date: 01/16/2024 CLINICAL DATA:  Provided history: Neuro deficit, acute stroke suspected. EXAM: CT ANGIOGRAPHY HEAD AND NECK WITH AND WITHOUT CONTRAST TECHNIQUE: Multidetector CT imaging of the head and neck was performed using the standard protocol during bolus administration of intravenous contrast. Multiplanar CT image reconstructions and MIPs were obtained to evaluate the vascular anatomy. Carotid stenosis measurements (when applicable) are obtained utilizing NASCET criteria, using the distal internal carotid diameter as the denominator. RADIATION DOSE REDUCTION: This exam was performed according to the departmental dose-optimization program which includes automated exposure control, adjustment of the mA and/or kV according to patient size and/or use of iterative reconstruction technique. CONTRAST:  75mL OMNIPAQUE  IOHEXOL  350 MG/ML SOLN COMPARISON:  CT head and CT cervical spine examinations performed earlier today 01/16/2024. FINDINGS: CTA NECK FINDINGS Mildly motion degraded exam. Within this limitation, findings are as follows. Aortic arch: Standard aortic branching. The visualized thoracic aorta is normal in caliber. No hemodynamically significant innominate or proximal subclavian artery stenosis. Right carotid system: CCA and ICA patent within the neck without stenosis or significant atherosclerotic disease. No evidence of traumatic vascular injury Left carotid system: CCA and ICA patent within the neck without stenosis. Minimal atherosclerotic plaque about the carotid bifurcation. No evidence of traumatic  vascular injury. Vertebral arteries: The vertebral arteries are patent within the neck. The right vertebral artery is diminutive and this appears developmental as the right transverse foramina are also asymmetrically diminutive. No evidence of traumatic vascular injury to the cervical vertebral arteries. Skeleton: Please refer to the CT head and CT cervical spine examinations for skull and cervical spine findings. Other neck: No mass lymphadenopathy or hematoma identified within the neck. Upper chest: No consolidation within the imaged lung apices. Review of the MIP images confirms the above findings CTA HEAD FINDINGS Anterior circulation: The intracranial internal carotid arteries are patent. Nonstenotic calcified plaque within the paraclinoid segments bilaterally. The M1 middle cerebral arteries are patent. No M2 proximal branch occlusion or high-grade proximal stenosis. The anterior cerebral arteries are patent. The left A1 segment is developmentally absent. No intracranial aneurysm is identified. Posterior circulation: The non-dominant right vertebral artery remains developmentally diminutive intracranially. The dominant left vertebral artery is patent intracranially without stenosis. The basilar artery is patent. The posterior cerebral arteries are patent. The right posterior communicating artery is diminutive or absent. Fetal origin left PCA. Venous sinuses: Assessment for dural venous sinus thrombosis is limited due to contrast timing. Anatomic variants: As described. Review of the MIP images confirms the above findings IMPRESSION: CTA neck: 1. The common carotid and internal carotid arteries are patent within the neck without stenosis or evidence of traumatic vascular injury. Minimal atherosclerotic plaque about the left carotid bifurcation. 2. The dominant left vertebral artery is patent within the neck without stenosis. Developmentally diminutive but patent right vertebral artery. No evidence of traumatic  vascular injury. CTA head: 1. No proximal intracranial large vessel occlusion or high-grade proximal arterial stenosis. The non-dominant right vertebral artery remains developmentally diminutive, but patent, intracranially. 2. Non-stenotic atherosclerotic plaque within the paraclinoid internal carotid arteries. Electronically Signed   By: Bascom Lily D.O.   On: 01/16/2024 17:28   CT HEAD WO CONTRAST ( ) Addendum Date: 01/16/2024 ADDENDUM REPORT: 01/16/2024 11:31 ADDENDUM: Study discussed by telephone with Dr. Vicenta Graft at 1122 hours on 01/16/2024. Electronically Signed   By: Marlise Simpers M.D.   On: 01/16/2024 11:31   Result Date: 01/16/2024 CLINICAL DATA:  43 year old male with recurrent falls. Right skull base fracture and posttraumatic intracranial hemorrhage on head CT this morning. EXAM: CT HEAD WITHOUT CONTRAST TECHNIQUE: Contiguous axial images were obtained from the base of the skull through the vertex without intravenous contrast. RADIATION DOSE REDUCTION: This exam was performed according to the departmental dose-optimization program which includes automated exposure control, adjustment of the mA and/or kV according to patient size and/or use of iterative reconstruction technique. COMPARISON:  Head CT 0413 hours today. FINDINGS: Brain: Bilateral inferior frontal gyrus hemorrhage along the anterior cranial fossa now more resembles discrete hemorrhagic contusions than subdural or subarachnoid blood on series 2, image 8 (each about 10-15 mm). Minimal adjacent edema. No significant regional mass effect. Bilateral mixed density subdural hematoma are 6 mm on both sides now (coronal image 32), but remain predominantly hypodense). Stable right posteroinferior cerebellar hemorrhagic contusion. However, there is now a larger area of right inferior cerebellum hypodense edema, suspicious for superimposed ischemic infarct (series 4, image 56). Small volume of superimposed right cerebellar subarachnoid hemorrhage is  stable. Supratentorial subarachnoid blood greater on the left side is stable. No midline shift. No intraventricular hemorrhage or ventriculomegaly. Basilar cisterns remain patent. No supratentorial confluent cerebral edema. Vascular: No suspicious intracranial vascular hyperdensity. Skull: Stable nondisplaced right occipital bone fracture tracking to the right jugular foramen. Sinuses/Orbits: Stable. Other: Mild right posterior scalp hematoma/contusion. IMPRESSION: 1. Increased right inferolateral cerebellar hemisphere edema superimposed on stable small hemorrhagic contusion and right cerebellar SAH, suspicious for an acute/evolving Ischemic Infarct such as due to distal right vertebral artery injury. Follow-up CTA Head and Neck recommended. 2. Bilateral SDH has mildly progressed since 0413 hours today, now 6 mm and remaining symmetric. 3. Small right inferior frontal gyrus hemorrhagic contusions now are apparent (10-15 mm). 4. Stable SAH. 5. No midline shift or significant intracranial mass effect. No IVH or ventriculomegaly. 6. Stable nondisplaced right occipital bone fracture. Electronically Signed: By: Marlise Simpers M.D. On: 01/16/2024 11:13   CT HEAD WO CONTRAST ( ) Addendum Date: 01/16/2024 ADDENDUM REPORT: 01/16/2024 04:49 ADDENDUM: Study discussed by telephone with Dr. Starling Eck WARD on 01/16/2024 at 0437 hours. Electronically Signed   By: Marlise Simpers M.D.   On: 01/16/2024 04:49   Result Date: 01/16/2024 CLINICAL DATA:  43 year old male with recurrent falls. EXAM: CT HEAD WITHOUT CONTRAST TECHNIQUE: Contiguous axial images were obtained from the base of the skull through the vertex without intravenous contrast. RADIATION DOSE REDUCTION: This exam was performed according to the departmental dose-optimization program which includes automated exposure control, adjustment of the mA and/or kV according to patient size and/or use of iterative reconstruction technique. COMPARISON:  Head CT 05/27/2023. FINDINGS: Brain:  Multifocal intracranial hemorrhage is detailed below. No significant midline shift and pre-existing mild asymmetry of the lateral ventricles (normal variant). No ventriculomegaly. Basilar cisterns remain patent. Supratentorial gray-white differentiation is stable. No cortically based acute infarct identified. Small right posteroinferior cerebellar hemorrhagic contusion (detailed below).  No significant posterior fossa edema or mass effect. Vascular: Calcified atherosclerosis at the skull base. No suspicious intracranial vascular hyperdensity. Skull: Right posterior, occipital nondepressed skull fracture tracking from below, medial to the right lambdoid suture into the right skull base and to the right jugular foramen (series 2, images 1, 19, 40). No 2nd skull fracture is identified. Sinuses/Orbits: Visualized paranasal sinuses and mastoids are stable and well aerated. Debris in the right external auditory canal. Other: Broad-based right posterior convexity scalp hematoma/contusion, 5-6 mm in thickness on series 2, image 29. Underlying nondepressed skull fracture. Orbits soft tissues not included. ---------------------------------------------------- Traumatic Brain Injury Risk Stratification Skull Fracture: Nondisplaced - Moderate/mBIG 2 Subdural Hematoma (SDH): 4mm to <85mm - mBIG 2 Bilateral 4 mm mixed density subdural hematoma (series 3, image 12, coronal images 18, 23, 30). Subarachnoid Hemorrhage [facility]): multifocal, bilateral - High/mBIG 3 Left sylvian fissure SAH is most apparent, but there is evidence of anterior bifrontal subarachnoid blood also (on the right series 5, image 21). Epidural Hematoma (EDH): No - Low/mBIG 1 Cerebral contusion, intra-axial, intraparenchymal Hemorrhage (IPH): 8mm plus or multiple - High/mBIG 3 10 mm right posterior cerebellar hemisphere hemorrhagic contusion on series 3, image 4. Intraventricular Hemorrhage (IVH): No - Low/mBIG 1 Midline Shift > 1mm or Edema/effacement of sulci/vents:  No - Low/mBIG 1 ---------------------------------------------------- IMPRESSION: 1. Positive for multifocal posttraumatic intracranial hemorrhage: - bilateral mixed density SDH, 4 mm. - scattered bilateral SAH, most apparent in the left sylvian fissure. - right posterior cerebellar hemorrhagic contusion, 10 mm. 2. Nondepressed right posterior convexity skull fracture tracking into the right skull base, right jugular foramen. 3. No midline shift or significant intracranial mass effect at this time. Electronically Signed: By: Marlise Simpers M.D. On: 01/16/2024 04:32   CT Cervical Spine Wo Contrast Result Date: 01/16/2024 CLINICAL DATA:  43 year old male with recurrent falls. EXAM: CT CERVICAL SPINE WITHOUT CONTRAST TECHNIQUE: Multidetector CT imaging of the cervical spine was performed without intravenous contrast. Multiplanar CT image reconstructions were also generated. RADIATION DOSE REDUCTION: This exam was performed according to the departmental dose-optimization program which includes automated exposure control, adjustment of the mA and/or kV according to patient size and/or use of iterative reconstruction technique. COMPARISON:  Head CT today.  Cervical spine CT 05/27/2023. FINDINGS: Alignment: Chronic straightening of cervical lordosis, mild reversal nail. Cervicothoracic junction alignment is within normal limits. Bilateral posterior element alignment is within normal limits. Skull base and vertebrae: Mild motion artifact. Linear nondepressed right skull base fracture tracking from the right occipital bone through the posterior fossa and terminates at the right jugular foramen. See Head CT reported separately. Maintained craniocervical alignment. C1-C2 alignment appears maintained. No cervical vertebral fracture identified when allowing for motion artifact. Soft tissues and spinal canal: No prevertebral fluid or swelling. No visible canal hematoma. Negative visible noncontrast neck soft tissues when allowing for  motion artifact. Disc levels: Intermittent chronic cervical disc and endplate degeneration appears stable from last year. Upper chest: Grossly intact visible upper thoracic levels, negative noncontrast thoracic inlet. IMPRESSION: 1. Mild motion artifact. No acute traumatic injury identified in the cervical spine. 2. Right skull base fracture, see abnormal Head CT reported separately. Electronically Signed   By: Marlise Simpers M.D.   On: 01/16/2024 04:35    Microbiology: No results found for this or any previous visit.  Labs: CBC: Recent Labs  Lab 01/17/24 0536 01/19/24 0911  WBC 6.9 7.2  HGB 16.6 17.7*  HCT 45.7 49.3  MCV 98.5 97.0  PLT 120* 145*   Basic  Metabolic Panel: Recent Labs  Lab 01/18/24 0855 01/19/24 0911 01/20/24 1149 01/21/24 1322 01/22/24 0525 01/23/24 0505  NA 127* 126* 128* 127* 129* 133*  K 4.1 4.2 4.3 4.3 4.4 4.4  CL 95* 94* 96* 96* 100 99  CO2 21* 22 21* 19* 21* 23  GLUCOSE 93 94 88 85 85 89  BUN 10 14 18 20  24* 19  CREATININE 0.96 1.03 1.03 1.12 1.16 1.18  CALCIUM 8.9 9.2 9.3 9.2 9.3 9.3  PHOS 3.2  --   --   --   --   --    Liver Function Tests: Recent Labs  Lab 01/18/24 0855  ALBUMIN 3.7   CBG: No results for input(s): "GLUCAP" in the last 168 hours.  Discharge time spent: greater than 30 minutes.  Signed: Sheril Dines, MD [facility] 01/23/2024

## 2024-01-23 NOTE — Telephone Encounter (Signed)
 Patient needs a repeat head CT prior to appointment per Dr. Mont Antis. Can this be ordered?

## 2024-01-23 NOTE — Progress Notes (Signed)
 [facility]  ROUNDING NOTE   Subjective:   Patient seen resting quietly Continues to refuse vitals, nursing notes states patient is agitated and will not like to be awoken at 4 AM  Sodium 133  Objective:  Vital signs in last 24 hours:  Temp:  [98.8 F (37.1 C)] 98.8 F (37.1 C) (06/05 0812) Pulse Rate:  [70-97] 70 (06/05 0812) Resp:  [18] 18 (06/05 0812) BP: (116-117)/(61-80) 116/79 (06/05 0812) SpO2:  [99 %-100 %] 99 % (06/04 2000)  Weight change:  Filed Weights   01/16/24 0351  Weight: 134.9 kg    Intake/Output: No intake/output data recorded.   Intake/Output this shift:  Total I/O In: 240 [P.O.:240] Out: -   Physical Exam: General: NAD  Head: Normocephalic, atraumatic.   Eyes: Anicteric  Lungs:  Clear to auscultation  Heart: Regular rate and rhythm  Abdomen:  Soft, nontender  Extremities:  No peripheral edema.  Neurologic: Somnolent  Skin: Warm,dry, no rash       Basic Metabolic Panel: Recent Labs  Lab 01/18/24 0855 01/19/24 0911 01/20/24 1149 01/21/24 1322 01/22/24 0525 01/23/24 0505  NA 127* 126* 128* 127* 129* 133*  K 4.1 4.2 4.3 4.3 4.4 4.4  CL 95* 94* 96* 96* 100 99  CO2 21* 22 21* 19* 21* 23  GLUCOSE 93 94 88 85 85 89  BUN 10 14 18 20  24* 19  CREATININE 0.96 1.03 1.03 1.12 1.16 1.18  CALCIUM 8.9 9.2 9.3 9.2 9.3 9.3  PHOS 3.2  --   --   --   --   --     Liver Function Tests: Recent Labs  Lab 01/18/24 0855  ALBUMIN 3.7   No results for input(s): "LIPASE", "AMYLASE" in the last 168 hours. No results for input(s): "AMMONIA" in the last 168 hours.   CBC: Recent Labs  Lab 01/17/24 0536 01/19/24 0911  WBC 6.9 7.2  HGB 16.6 17.7*  HCT 45.7 49.3  MCV 98.5 97.0  PLT 120* 145*    Cardiac Enzymes: No results for input(s): "CKTOTAL", "CKMB", "CKMBINDEX", "TROPONINI" in the last 168 hours.   BNP: Invalid input(s): "POCBNP"  CBG: No results for input(s): "GLUCAP" in the last 168 hours.   Microbiology: No  results found for this or any previous visit.  Coagulation Studies: No results for input(s): "LABPROT", "INR" in the last 72 hours.  Urinalysis: No results for input(s): "COLORURINE", "LABSPEC", "PHURINE", "GLUCOSEU", "HGBUR", "BILIRUBINUR", "KETONESUR", "PROTEINUR", "UROBILINOGEN", "NITRITE", "LEUKOCYTESUR" in the last 72 hours.  Invalid input(s): "APPERANCEUR"    Imaging: No results found.   Medications:    thiamine  (VITAMIN B1) injection Stopped (01/21/24 1007)    enoxaparin (LOVENOX) injection  0.5 mg/kg Subcutaneous QPM   feeding supplement  237 mL Oral BID BM   folic acid   1 mg Oral Daily   gabapentin  300 mg Oral BID   levETIRAcetam   500 mg Oral BID   multivitamin with minerals  1 tablet Oral Daily   phenobarbital  32.4 mg Oral TID   sodium chloride   2 g Oral BID WC   acetaminophen , labetalol , ondansetron  (ZOFRAN ) IV  Assessment/ Plan:  Mark Gordon is a 43 y.o.  male with medical problems of alcohol abuse was admitted on 01/16/2024 for Hyponatremia [E87.1] SAH (subarachnoid hemorrhage) (HCC) [I60.9] SDH (subdural hematoma) (HCC) [S06.5XAA] Skull fracture with cerebral contusion, closed, initial encounter (HCC) [S02.91XA, S06.33AA] Alcoholic intoxication with complication (HCC) [F10.929]   Hyponatremia Differential includes chronic hyponatremia due to alcohol abuse, with worsening  of Na related to SIADH versus cerebral salt wasting. CSW is characterized by extracellular volume depletion such as hypotension and decreased skin turgor.   Sodium continues to improve, 133 today.  Will continue salt tabs as ordered at discharge.  Patient to follow-up with primary care physician for sodium monitoring.  We will sign off at this time.  Feel free to reconsult if needed.   LOS: 7 Mark Gordon 6/5/202510:57 AM

## 2024-01-23 NOTE — Progress Notes (Signed)
 Patient refused vital signs for 0000 by RN, was agitated and stated to me he does not want to be woken up at 0400. Will continue to monitor patient.

## 2024-01-23 NOTE — Telephone Encounter (Signed)
 CT has been ordered.

## 2024-01-28 NOTE — Telephone Encounter (Signed)
 Left message for patient to call our office back.

## 2024-02-07 NOTE — Telephone Encounter (Signed)
 Left message for patient to call our office back.

## 2024-02-29 DIAGNOSIS — Z419 Encounter for procedure for purposes other than remedying health state, unspecified: Secondary | ICD-10-CM | POA: Diagnosis not present

## 2024-03-05 ENCOUNTER — Ambulatory Visit: Payer: Self-pay | Admitting: Neurosurgery

## 2024-03-11 ENCOUNTER — Ambulatory Visit: Payer: MEDICAID

## 2024-03-16 ENCOUNTER — Ambulatory Visit: Admission: RE | Admit: 2024-03-16 | Source: Ambulatory Visit

## 2024-03-19 ENCOUNTER — Ambulatory Visit
Admission: RE | Admit: 2024-03-19 | Discharge: 2024-03-19 | Disposition: A | Discharge status: Home / Self Care | Source: Ambulatory Visit | Attending: Neurosurgery | Admitting: Neurosurgery

## 2024-03-19 ENCOUNTER — Emergency Department
Admission: EM | Admit: 2024-03-19 | Discharge: 2024-03-19 | Disposition: A | Discharge status: Home / Self Care | Attending: Emergency Medicine | Admitting: Emergency Medicine

## 2024-03-19 ENCOUNTER — Other Ambulatory Visit: Payer: Self-pay

## 2024-03-19 DIAGNOSIS — S065X0A Traumatic subdural hemorrhage without loss of consciousness, initial encounter: Secondary | ICD-10-CM | POA: Insufficient documentation

## 2024-03-19 DIAGNOSIS — S065XAA Traumatic subdural hemorrhage with loss of consciousness status unknown, initial encounter: Secondary | ICD-10-CM | POA: Diagnosis not present

## 2024-03-19 DIAGNOSIS — S065XAD Traumatic subdural hemorrhage with loss of consciousness status unknown, subsequent encounter: Secondary | ICD-10-CM

## 2024-03-19 DIAGNOSIS — W19XXXA Unspecified fall, initial encounter: Secondary | ICD-10-CM | POA: Insufficient documentation

## 2024-03-19 DIAGNOSIS — S06A1XA Traumatic brain compression with herniation, initial encounter: Secondary | ICD-10-CM | POA: Insufficient documentation

## 2024-03-19 DIAGNOSIS — S06A0XA Traumatic brain compression without herniation, initial encounter: Secondary | ICD-10-CM | POA: Diagnosis not present

## 2024-03-19 DIAGNOSIS — S0003XA Contusion of scalp, initial encounter: Secondary | ICD-10-CM | POA: Diagnosis not present

## 2024-03-19 DIAGNOSIS — S0990XA Unspecified injury of head, initial encounter: Secondary | ICD-10-CM | POA: Diagnosis present

## 2024-03-19 DIAGNOSIS — S062XAA Diffuse traumatic brain injury with loss of consciousness status unknown, initial encounter: Secondary | ICD-10-CM

## 2024-03-19 DIAGNOSIS — W19XXXD Unspecified fall, subsequent encounter: Secondary | ICD-10-CM | POA: Diagnosis not present

## 2024-03-19 DIAGNOSIS — S02119A Unspecified fracture of occiput, initial encounter for closed fracture: Secondary | ICD-10-CM | POA: Diagnosis not present

## 2024-03-19 DIAGNOSIS — R9431 Abnormal electrocardiogram [ECG] [EKG]: Secondary | ICD-10-CM | POA: Diagnosis not present

## 2024-03-19 LAB — CBC WITH DIFFERENTIAL/PLATELET
Abs Immature Granulocytes: 0.03 K/uL (ref 0.00–0.07)
Basophils Absolute: 0 K/uL (ref 0.0–0.1)
Basophils Relative: 1 %
Eosinophils Absolute: 0.1 K/uL (ref 0.0–0.5)
Eosinophils Relative: 1 %
HCT: 52 % (ref 39.0–52.0)
Hemoglobin: 17.9 g/dL — ABNORMAL HIGH (ref 13.0–17.0)
Immature Granulocytes: 1 %
Lymphocytes Relative: 14 %
Lymphs Abs: 0.8 K/uL (ref 0.7–4.0)
MCH: 34.8 pg — ABNORMAL HIGH (ref 26.0–34.0)
MCHC: 34.4 g/dL (ref 30.0–36.0)
MCV: 101 fL — ABNORMAL HIGH (ref 80.0–100.0)
Monocytes Absolute: 0.4 K/uL (ref 0.1–1.0)
Monocytes Relative: 8 %
Neutro Abs: 4.3 K/uL (ref 1.7–7.7)
Neutrophils Relative %: 75 %
Platelets: 151 K/uL (ref 150–400)
RBC: 5.15 MIL/uL (ref 4.22–5.81)
RDW: 13.9 % (ref 11.5–15.5)
WBC: 5.7 K/uL (ref 4.0–10.5)
nRBC: 0 % (ref 0.0–0.2)

## 2024-03-19 LAB — COMPREHENSIVE METABOLIC PANEL WITH GFR
ALT: 29 U/L (ref 0–44)
AST: 36 U/L (ref 15–41)
Albumin: 3.5 g/dL (ref 3.5–5.0)
Alkaline Phosphatase: 69 U/L (ref 38–126)
Anion gap: 9 (ref 5–15)
BUN: 7 mg/dL (ref 6–20)
CO2: 21 mmol/L — ABNORMAL LOW (ref 22–32)
Calcium: 9 mg/dL (ref 8.9–10.3)
Chloride: 100 mmol/L (ref 98–111)
Creatinine, Ser: 0.97 mg/dL (ref 0.61–1.24)
GFR, Estimated: >60 mL/min (ref >60)
Glucose, Bld: 121 mg/dL — ABNORMAL HIGH (ref 70–99)
Potassium: 4.8 mmol/L (ref 3.5–5.1)
Sodium: 130 mmol/L — ABNORMAL LOW (ref 135–145)
Total Bilirubin: 1.6 mg/dL — ABNORMAL HIGH (ref 0.0–1.2)
Total Protein: 6.9 g/dL (ref 6.5–8.1)

## 2024-03-19 LAB — MAGNESIUM: Magnesium: 2.1 mg/dL (ref 1.7–2.4)

## 2024-03-19 LAB — PROTIME-INR
INR: 1 (ref 0.8–1.2)
Prothrombin Time: 13.8 s (ref 11.4–15.2)

## 2024-03-19 LAB — ETHANOL: Alcohol, Ethyl (B): <15 mg/dL (ref <15)

## 2024-03-19 NOTE — ED Provider Notes (Signed)
 [facility] Provider Note    Event Date/Time   First MD Initiated Contact with Patient 03/19/24 1614     (approximate)   History   abnormal result   HPI  Mark Gordon is a 43 y.o. male who presents to the ED for evaluation of abnormal result   I reviewed medical DC summary from 6/5.  Patient with history of alcohol abuse who came in acutely intoxicated with frequent falls and had multiple subdural and subarachnoid hemorrhages, nondisplaced occipital bone.  Managed nonoperatively, but stayed in the hospital for 6 days due to alcohol withdrawals and hyponatremia. I also review a repeat CT scan of the head that was obtained today with increase in size of bilateral subdural hematomas, with areas of hyperattenuating acute blood products as well.  Increased midline shift now with 2 mm shift, L -> R.  Patient presents due to his abnormal CT scan.  He is annoyed that he is here, agitated.  Reports that he is not staying in the hospital again.  His mom is at the bedside, he stays with her.  He reports at least 8 beers per day as well as a small bottle of liquor  Physical Exam   Triage Vital Signs: ED Triage Vitals  Encounter Vitals Group     BP 03/19/24 1613 (!) 161/113     Girls Systolic BP Percentile --      Girls Diastolic BP Percentile --      Boys Systolic BP Percentile --      Boys Diastolic BP Percentile --      Pulse Rate 03/19/24 1612 90     Resp 03/19/24 1612 16     Temp 03/19/24 1612 97.9 F (36.6 C)     Temp Source 03/19/24 1612 Oral     SpO2 03/19/24 1612 97 %     Weight 03/19/24 1612 297 lb 6.4 oz (134.9 kg)     Height --      Head Circumference --      Peak Flow --      Pain Score 03/19/24 1612 0     Pain Loc --      Pain Education --      Exclude from Growth Chart --     Most recent vital signs: Vitals:   03/19/24 1615 03/19/24 1622  BP: (!) 153/103   Pulse: 90   Resp:    Temp:    SpO2: 97% 96%    General: Awake, no  distress.  Irritable CV:  Good peripheral perfusion.  Resp:  Normal effort.  Abd:  No distention.  MSK:  No deformity noted.  Neuro:  No focal deficits appreciated. Other:     ED Results / Procedures / Treatments   Labs (all labs ordered are listed, but only abnormal results are displayed) Labs Reviewed  CBC WITH DIFFERENTIAL/PLATELET - Abnormal; Notable for the following components:      Result Value   Hemoglobin 17.9 (*)    MCV 101.0 (*)    MCH 34.8 (*)    All other components within normal limits  COMPREHENSIVE METABOLIC PANEL WITH GFR - Abnormal; Notable for the following components:   Sodium 130 (*)    CO2 21 (*)    Glucose, Bld 121 (*)    Total Bilirubin 1.6 (*)    All other components within normal limits  PROTIME-INR  ETHANOL  MAGNESIUM    EKG Sinus rhythm with a rate of 91 bpm.  Normal axis  and intervals without clear signs of acute ischemia.  RADIOLOGY CT scan head interpreted by me with bilateral subdurals, acute component mostly on the left with a couple millimeters of left-to-right shift  Official radiology report(s): CT HEAD WO CONTRAST ( ) Addendum Date: 03/19/2024 ADDENDUM REPORT: 03/19/2024 15:40 ADDENDUM: These results were called by telephone at the time of interpretation on 03/19/2024 at 3:40 pm to provider [facility] , who verbally acknowledged these results. Electronically Signed   By: Donnice Mania M.D.   On: 03/19/2024 15:40   Result Date: 03/19/2024 CLINICAL DATA:  Subdural hematoma after fall on 01/16/2024. EXAM: CT HEAD WITHOUT CONTRAST TECHNIQUE: Contiguous axial images were obtained from the base of the skull through the vertex without intravenous contrast. RADIATION DOSE REDUCTION: This exam was performed according to the departmental dose-optimization program which includes automated exposure control, adjustment of the mA and/or kV according to patient size and/or use of iterative reconstruction technique. COMPARISON:  CT head 01/17/2024  FINDINGS: Brain: New hyperattenuating blood products within the subdural collection over the right frontal lobe measuring up to 7 mm in thickness. Additional component of subdural hematoma more posteriorly over the right parietal lobe measures up to 4 mm. Mild local mass effect. Additional subdural hematoma over the left cerebral convexity demonstrating mixed attenuation. There is a component of hyperattenuating blood products within the collection on the left measuring up to 4 mm in thickness which is new from prior. The overall thickness of the collection measures up to 14 mm on coronal images over the left frontal lobe. Previous maximum thickness of approximately 6-7 mm on the left and 8 mm on the right. There is mild mass effect and sulcal effacement throughout the left cerebral hemisphere. Approximately 2 mm rightward midline shift. 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 The basilar cisterns are patent. Ventricles: The ventricles are normal. Vascular: Atherosclerotic calcifications of the carotid siphons. No hyperdense vessel. Skull: Nondepressed fracture of the right occipital calvarium extending in to the jugular foramen. Possible subtle widening at the fracture site noted on series 4 image 8. Orbits: Orbits are symmetric. Sinuses: Mucosal thickening in the ethmoid sinuses. Other: Mastoid air cells are clear. IMPRESSION: Interval increase in size of bilateral sudural hematomas with new areas of hyperattenuating acute blood products. Increased mass effect with new 2mm rightward midline shift. Redemonstrated non-depressed fracture of the right occipital calvarium. Possible mild widening at the fracture site. Electronically Signed: By: Donnice Mania M.D. On: 03/19/2024 15:23    PROCEDURES and INTERVENTIONS:  .Critical Care  Performed by: Claudene Rover, MD Authorized by: Claudene Rover, MD   Critical care provider statement:    Critical care time (minutes):  30   Critical care time was exclusive of:   Separately billable procedures and treating other patients   Critical care was necessary to treat or prevent imminent or life-threatening deterioration of the following conditions:  CNS failure or compromise   Critical care was time spent personally by me on the following activities:  Development of treatment plan with patient or surrogate, discussions with consultants, evaluation of patient's response to treatment, examination of patient, ordering and review of laboratory studies, ordering and review of radiographic studies, ordering and performing treatments and interventions, pulse oximetry, re-evaluation of patient's condition and review of old charts .1-3 Lead EKG Interpretation  Performed by: Claudene Rover, MD Authorized by: Claudene Rover, MD     Interpretation: normal     ECG rate:  90   ECG rate assessment: normal     Rhythm:  sinus rhythm     Ectopy: none     Conduction: normal     Medications - No data to display   IMPRESSION / MDM / ASSESSMENT AND PLAN / ED COURSE  I reviewed the triage vital signs and the nursing notes.  Differential diagnosis includes, but is not limited to, acute intoxication, withdrawals, traumatic SDH, spontaneous SDH, Warnicke encephalopathy  {Patient presents with symptoms of an acute illness or injury that is potentially life-threatening.  Alcoholic patient presents to the ED due to abnormal outpatient CT head with acute on chronic SDH and small midline shift.  He is GCS 15 and not intoxicated, negative serum ethanol level.  He is adamantly refusing staying in the hospital and all intervention.  He is here with his mother and seems to be at his baseline and has capacity.  He is evaluated by neurosurgery and explained risks, benefits and recommended drainage but he again declines despite  repeated conversations.  Mild chronic hyponatremia that I do not believe is contributing to any significant encephalopathy.  Normal CBC, magnesium level.  Discussed ED  return precautions.  Discharged with mother.  Clinical Course as of 03/19/24 1719  Thu Mar 19, 2024  1634 Reassessed because I heard yelling from his room.  He apparently was having argument with his mother at the bedside, yelling at her indicating that he will not be staying in the hospital. [DS]  1703 Dr. Clois at the bedside [DS]  1716 Patient seen and evaluated by neurosurgery and he is demanding discharge.  Neurosurgery had recommended drainage but patient reports he wants to do it another time.  Neurosurgeon agrees that patient has capacity to refuse and I agree.  He is not acutely intoxicated and seems to understand the risks. [DS]    Clinical Course User Index [DS] Claudene Rover, MD     FINAL CLINICAL IMPRESSION(S) / ED DIAGNOSES   Final diagnoses:  SDH (subdural hematoma) (HCC)  Traumatic hematoma of brain with compression and midline shift of brain (HCC)     Rx / DC Orders   ED Discharge Orders     None        Note:  This document was prepared using Dragon voice recognition software and may include unintentional dictation errors.   Claudene Rover, MD 03/19/24 [id number]

## 2024-03-19 NOTE — ED Triage Notes (Signed)
 Had CT head today as follow up from fall one month ago and brain bleed. Dr. Katrina sent patient to ED for re-evaluation. AAOx3. Skin warm and dry. NAD

## 2024-03-19 NOTE — ED Triage Notes (Signed)
 First nurse note: Pt had CT scan done today from follow up fall. Pt + for midline shift.

## 2024-03-19 NOTE — Consult Note (Signed)
 Consult requested by:  Dr. Claudene  Consult requested for:  Subdural hematoma  Primary Physician:  Patient, No Pcp Per  History of Present Illness: 03/19/2024 Mark Gordon is here today with a chief complaint of worsening imaging findings on his CT scan.  I previously saw him during his prior admission for subdural hematoma.  He has been out of the hospital for a few weeks.  He had his repeat scan today, which showed increased size of his subdural fluid collection.  He presents for evaluation.  He has intermittent headaches.  He denies further symptoms.  He is very irritable.  I have utilized the care everywhere function in epic to review the outside records available from external health systems.  Review of Systems:  A 10 point review of systems is negative, except for the pertinent positives and negatives detailed in the HPI.  Past Medical History: No past medical history on file.  Past Surgical History: Past Surgical History:  Procedure Laterality Date   TYMPANOSTOMY TUBE PLACEMENT      Allergies: Allergies as of 03/19/2024   (No Known Allergies)    Medications: No outpatient medications have been marked as taking for the 03/19/24 encounter [facility] Encounter).    Social History: Social History   Tobacco Use   Smoking status: Every Day    Current packs/day: 0.25    Types: Cigarettes   Smokeless tobacco: Never  Vaping Use   Vaping status: Never Used  Substance Use Topics   Alcohol use: Yes    Alcohol/week: 24.0 standard drinks of alcohol    Types: 24 Cans of beer per week   Drug use: Yes    Types: Marijuana    Family Medical History: No family history on file.  Physical Examination: Vitals:   03/19/24 1622 03/19/24 1700  BP:  (!) 162/103  Pulse:    Resp:  (!) 29  Temp:    SpO2: 96%     General: Patient is in no apparent distress. Attention to examination is appropriate.  Neck:   Supple.  Full range of motion.  Respiratory: Patient is  breathing without any difficulty.   NEUROLOGICAL:     Awake, alert, oriented to person, place, and time.  Speech is clear and fluent.  Cranial Nerves: Pupils equal round and reactive to light.  Facial tone is symmetric.  Facial sensation is symmetric. Shoulder shrug is symmetric. Tongue protrusion is midline.  There is no pronator drift.  Strength: Side Biceps Triceps Deltoid Interossei Grip Wrist Ext. Wrist Flex.  R 5 5 5 5 5 5 5   L 5 5 5 5 5 5 5    Side Iliopsoas Quads Hamstring PF DF EHL  R 5 5 5 5 5 5   L 5 5 5 5 5 5    Reflexes are 1+ and symmetric at the biceps, triceps, brachioradialis, patella and achilles.   Hoffman's is absent.   Bilateral upper and lower extremity sensation is intact to light touch.    No evidence of dysmetria noted.  Gait is untested.     Medical Decision Making  Imaging: CT Head 03/19/2024 IMPRESSION: Interval increase in size of bilateral sudural hematomas with new areas of hyperattenuating acute blood products. Increased mass effect with new 2mm rightward midline shift.   Redemonstrated non-depressed fracture of the right occipital calvarium. Possible mild widening at the fracture site.   Electronically Signed: By: Donnice Mania M.D. On: 03/19/2024 15:23  I have personally reviewed the images and agree with the  above interpretation.  Assessment and Plan: [name] is a pleasant 43 y.o. male with increasing size of subdural hematoma with new midline shift.  We discussed his worsening findings.  Based on this, I recommended bur hole drainage of his subdural hematoma.  He declined.  He expressed understanding of the risks.  He expressed interest in potentially obtaining this procedure next week.  We will be in touch with him.    I have communicated my recommendations to the requesting physician and coordinated care to facilitate these recommendations.     Laria Grimmett K. Clois MD, [facility] Neurosurgery

## 2024-03-20 ENCOUNTER — Telehealth: Payer: Self-pay | Admitting: Neurosurgery

## 2024-03-20 NOTE — Telephone Encounter (Signed)
 Please see below message.    Media Information  Document Information  AMB Correspondence  NEUROSURGERY ANSWERING SERVICE  03/19/2024 09:34  Attached To:  Mark Gordon  Source Information  Default, Provider, MD

## 2024-03-27 ENCOUNTER — Other Ambulatory Visit: Payer: Self-pay | Admitting: Neurosurgery

## 2024-03-27 DIAGNOSIS — S065XAA Traumatic subdural hemorrhage with loss of consciousness status unknown, initial encounter: Secondary | ICD-10-CM

## 2024-03-31 DIAGNOSIS — Z419 Encounter for procedure for purposes other than remedying health state, unspecified: Secondary | ICD-10-CM | POA: Diagnosis not present

## 2024-05-01 DIAGNOSIS — Z419 Encounter for procedure for purposes other than remedying health state, unspecified: Secondary | ICD-10-CM | POA: Diagnosis not present
